# Patient Record
Sex: Male | Born: 1945 | Race: White | Hispanic: No | Marital: Married
Health system: Western US, Academic
[De-identification: ages and names within clinical notes are randomized; demographics above are authoritative.]

## PROBLEM LIST (undated history)

## (undated) DIAGNOSIS — E079 Disorder of thyroid, unspecified: Secondary | ICD-10-CM

## (undated) DIAGNOSIS — I10 Essential (primary) hypertension: Secondary | ICD-10-CM

## (undated) DIAGNOSIS — I8390 Asymptomatic varicose veins of unspecified lower extremity: Secondary | ICD-10-CM

## (undated) DIAGNOSIS — K649 Unspecified hemorrhoids: Secondary | ICD-10-CM

## (undated) DIAGNOSIS — E039 Hypothyroidism, unspecified: Secondary | ICD-10-CM

## (undated) DIAGNOSIS — R351 Nocturia: Secondary | ICD-10-CM

## (undated) DIAGNOSIS — E785 Hyperlipidemia, unspecified: Secondary | ICD-10-CM

## (undated) DIAGNOSIS — A630 Anogenital (venereal) warts: Secondary | ICD-10-CM

## (undated) DIAGNOSIS — K644 Residual hemorrhoidal skin tags: Secondary | ICD-10-CM

## (undated) HISTORY — DX: Anogenital (venereal) warts: A63.0

## (undated) HISTORY — DX: Asymptomatic varicose veins of unspecified lower extremity: I83.90

## (undated) HISTORY — DX: Residual hemorrhoidal skin tags: K64.4

## (undated) HISTORY — DX: Essential (primary) hypertension: I10

## (undated) HISTORY — DX: Hyperlipidemia, unspecified: E78.5

## (undated) HISTORY — DX: Disorder of thyroid, unspecified: E07.9

## (undated) HISTORY — PX: ANUS SURGERY: SHX302

## (undated) HISTORY — PX: CONDYLOMA EXCISION/FULGURATION: SHX1389

---

## 1977-05-11 DIAGNOSIS — A630 Anogenital (venereal) warts: Secondary | ICD-10-CM

## 1977-05-11 HISTORY — PX: OTHER SURGICAL HISTORY: SHX169

## 1977-05-11 HISTORY — DX: Anogenital (venereal) warts: A63.0

## 1980-05-11 HISTORY — PX: HEMORROIDECTOMY: SUR656

## 2006-05-11 HISTORY — PX: OTHER SURGICAL HISTORY: SHX169

## 2007-04-29 ENCOUNTER — Encounter: Payer: Self-pay | Admitting: Internal Medicine

## 2008-01-10 ENCOUNTER — Ambulatory Visit: Payer: Self-pay | Admitting: Vascular Surgery

## 2008-03-21 ENCOUNTER — Emergency Department (HOSPITAL_COMMUNITY): Admission: EM | Admit: 2008-03-21 | Discharge: 2008-03-21 | Payer: Self-pay | Admitting: Emergency Medicine

## 2010-01-15 ENCOUNTER — Encounter: Admission: RE | Admit: 2010-01-15 | Discharge: 2010-01-15 | Payer: Self-pay | Admitting: Family Medicine

## 2010-01-27 ENCOUNTER — Encounter: Payer: Self-pay | Admitting: Internal Medicine

## 2010-03-28 ENCOUNTER — Encounter: Payer: Self-pay | Admitting: Internal Medicine

## 2010-05-11 HISTORY — PX: OTHER SURGICAL HISTORY: SHX169

## 2010-05-23 ENCOUNTER — Encounter: Payer: Self-pay | Admitting: Internal Medicine

## 2010-06-04 ENCOUNTER — Ambulatory Visit
Admission: RE | Admit: 2010-06-04 | Discharge: 2010-06-04 | Payer: Self-pay | Source: Home / Self Care | Attending: Internal Medicine | Admitting: Internal Medicine

## 2010-06-04 DIAGNOSIS — E785 Hyperlipidemia, unspecified: Secondary | ICD-10-CM | POA: Insufficient documentation

## 2010-06-04 DIAGNOSIS — I10 Essential (primary) hypertension: Secondary | ICD-10-CM | POA: Insufficient documentation

## 2010-06-04 DIAGNOSIS — Z860100 Personal history of colon polyps, unspecified: Secondary | ICD-10-CM | POA: Insufficient documentation

## 2010-06-04 DIAGNOSIS — M545 Low back pain, unspecified: Secondary | ICD-10-CM | POA: Insufficient documentation

## 2010-06-04 DIAGNOSIS — E039 Hypothyroidism, unspecified: Secondary | ICD-10-CM | POA: Insufficient documentation

## 2010-06-12 NOTE — Assessment & Plan Note (Signed)
Summary: new to estab/cbs   Vital Signs:  Patient profile:   65 year old male Height:      71 inches Weight:      175.2 pounds BMI:     24.52 Temp:     98.4 degrees F oral Pulse rate:   52 / minute Resp:     14 per minute BP sitting:   128 / 80  (left arm) Cuff size:   large  Vitals Entered By: Shonna Chock CMA (June 04, 2010 10:42 AM) CC: New patient establish: Discuss back ache and muscle pain x 5-6 months , Back pain   CC:  New patient establish: Discuss back ache and muscle pain x 5-6 months  and Back pain.  History of Present Illness:    Mr. Ryan Townsend [facility])  is here to establish as a new patient; he has chronic , recurrent  back symptoms..  The patient denies  associated fever, chills, weakness, loss of sensation, fecal incontinence, urinary incontinence, and urinary retention.  The pain  had  been  located in the mid low back  intermittently &  chronically  for 5-6 years. That pain always responded to stretching exercises.Mid thoracic back began in mid July after air  flight from [place].  There was no injury or trigger for the  pain . The pain does not radiate.  The pain is made worse by  sitting for > 30 minutes.  The pain is made better by activity.      Hyperlipidemia Follow-Up:  The patient denies muscle aches, GI upset, abdominal pain, flushing, itching, constipation, diarrhea, and fatigue.  The patient denies the following symptoms: chest pain/pressure, exercise intolerance, dypsnea, palpitations, syncope, and pedal edema.  Compliance with medications (by patient report) has been near 100%.  Dietary compliance has been good.  Adjunctive measures currently used by the patient include fiber, ASA, and fish oil supplements.  Lipids were @ goal in 01/2010.     Hypertension Follow-Up:  The patient denies lightheadedness, urinary frequency, and headaches.  Adjunctive measures currently used by the patient include salt restriction.   BP averages < 140/90.  Preventive  Screening-Counseling & Management  Alcohol-Tobacco     Smoking Status: quit  Caffeine-Diet-Exercise     Does Patient Exercise: yes  Current Medications (verified): 1)  Lipitor 10 Mg Tabs (Atorvastatin Calcium) .Marland Kitchen.. 1 By Mouth Once Daily 2)  Diovan 160 Mg Tabs (Valsartan) .Marland Kitchen.. 1 By Mouth Once Daily 3)  Synthroid 75 Mcg Tabs (Levothyroxine Sodium) .Marland Kitchen.. 1 By Mouth Once Daily 4)  Aspirin 81 Mg Tabs (Aspirin) .Marland Kitchen.. 1 By Mouth Once Daily 5)  Fish Oil 1000 Mg Caps (Omega-3 Fatty Acids) .Marland Kitchen.. 1 By Mouth Once Daily 6)  Metamucil 30.9 % Powd (Psyllium) .... As Needed  Allergies (verified): No Known Drug Allergies  Past History:  Past Medical History: Hypothyroidism Hyperlipidemia Hypertension Colonic polyps, hx of , Tubular Adenoma 2008, Dr Elnoria Howard; Mother: colon cancer;2  M cousins : colon cancer Vitamin D level 38.1; FBS 100  Past Surgical History: Hemorrhoidectomy  in  1982 Colon polypectomy  04/29/2007: Tubular Adenoma , Dr Elnoria Howard  Family History: Father: deceased, CAD/ MI @ 22, AAA Mother: deceased ,Colon Cancer, stroke,HTN Siblings: 1 sister:Chronic Kidney Disease; 1 brother:HTN, lipids; 1 sister: deceased, small cell  lung cancer  Social History: Occupation:Principle Scientist IT trainer) Married Former Smoker: quit 1978 Alcohol use-yes: rarely Regular exercise-yes: walking 30 min 5X / week ; cardio & weights 4X/ week X 60 min Smoking Status:  quit Does Patient Exercise:  yes  Review of Systems General:  Denies sleep disorder. Eyes:  Denies blurring, double vision, and vision loss-both eyes. ENT:  Denies difficulty swallowing and hoarseness. Resp:  Denies cough and sputum productive. GU:  Denies discharge, dysuria, and hematuria; PSA 1.3 in 09/11. MS:  Denies joint pain, joint redness, and joint swelling. Derm:  Denies changes in nail beds, dryness, hair loss, and lesion(s). Neuro:  Denies numbness and tingling. Psych:  Denies depression; Intermittent  anxiety. Endo:  Denies cold intolerance, excessive hunger, excessive thirst, excessive urination, and heat intolerance; TSH was 6.97 in 09/11; 4.16 in 11/11; 1.77 in 01/12. FBS 100. Heme:  Denies abnormal bruising and bleeding. Allergy:  Complains of seasonal allergies; Cortisone shot annually.  Physical Exam  General:  well-nourished,in no acute distress; alert,appropriate and cooperative throughout examination Head:  Normocephalic and atraumatic without obvious abnormalities. Pattern  alopecia  Eyes:  No corneal or conjunctival inflammation noted. EOMI. Perrla. Funduscopic exam benign, without hemorrhages, exudates or papilledema. Vision grossly normal. Ears:  External ear exam shows no significant lesions or deformities.  Otoscopic examination reveals clear canals, tympanic membranes are intact bilaterally without bulging, retraction, inflammation or discharge. Hearing is grossly normal bilaterally. Nose:  External nasal examination shows no deformity or inflammation. Nasal mucosa are pink and moist without lesions or exudates. Mouth:  Oral mucosa and oropharynx without lesions or exudates.  Teeth in good repair. Neck:  No deformities, masses, or tenderness noted. Lungs:  Normal respiratory effort, chest expands symmetrically. Lungs are clear to auscultation, no crackles or wheezes. Heart:  Normal rate and regular rhythm. S1 and S2 normal without gallop, murmur, click, rub . Abdomen:  Bowel sounds positive,abdomen soft and non-tender without masses, organomegaly or hernias noted. No AAA or bruits Msk:  No deformity or scoliosis noted of thoracic or lumbar spine.   Pulses:  R and L carotid,radial,dorsalis pedis and posterior tibial pulses are full and equal bilaterally Extremities:  No clubbing, cyanosis, edema, or deformity noted with normal full range of motion of all joints.  Neg SLR . Crepitus L knee > R. Slight flexion of L 5th digit  Neurologic:  alert & oriented X3, strength normal in  all extremities, gait (heel & toe) normal, and DTRs symmetrical and normal.   Skin:  Intact without suspicious lesions or rashes Cervical Nodes:  No lymphadenopathy noted Axillary Nodes:  No palpable lymphadenopathy Psych:  memory intact for recent and remote, normally interactive, and good eye contact.     Impression & Recommendations:  Problem # 1:  LOW BACK PAIN, CHRONIC (ICD-724.2)  His updated medication list for this problem includes:    Aspirin 81 Mg Tabs (Aspirin) .Marland Kitchen... 1 by mouth once daily    Cyclobenzaprine Hcl 5 Mg Tabs (Cyclobenzaprine hcl) .Marland Kitchen... 1-2 at bedtime as needed for back pain  Problem # 2:  HYPERTENSION (ICD-401.9)  His updated medication list for this problem includes:    Diovan 160 Mg Tabs (Valsartan) .Marland Kitchen... 1 by mouth once daily  Problem # 3:  HYPERLIPIDEMIA (ICD-272.4) Lipids @ goal His updated medication list for this problem includes:    Lipitor 10 Mg Tabs (Atorvastatin calcium) .Marland Kitchen... 1 by mouth once daily  Problem # 4:  HYPOTHYROIDISM (ICD-244.9) TSH therapeutic His updated medication list for this problem includes:    Synthroid 75 Mcg Tabs (Levothyroxine sodium) .Marland Kitchen... 1 by mouth once daily  Problem # 5:  COLONIC POLYPS, HX OF (ICD-V12.72)  Tubular Adenoma ; FH of colon cancer  Complete  Medication List: 1)  Lipitor 10 Mg Tabs (Atorvastatin calcium) .Marland Kitchen.. 1 by mouth once daily 2)  Diovan 160 Mg Tabs (Valsartan) .Marland Kitchen.. 1 by mouth once daily 3)  Synthroid 75 Mcg Tabs (Levothyroxine sodium) .Marland Kitchen.. 1 by mouth once daily 4)  Aspirin 81 Mg Tabs (Aspirin) .Marland Kitchen.. 1 by mouth once daily 5)  Fish Oil 1000 Mg Caps (Omega-3 fatty acids) .Marland Kitchen.. 1 by mouth once daily 6)  Metamucil 30.9 % Powd (Psyllium) .... As needed 7)  Cyclobenzaprine Hcl 5 Mg Tabs (Cyclobenzaprine hcl) .Marland Kitchen.. 1-2 at bedtime as needed for back pain  Patient Instructions: 1)  Consider fasting Labs: [place] Heart Panel (1304X) & A1c . Codes: 272.4, V17.3, 790.29. Stretching exercises as discussed. Consider  a trial of Chondroitin 800 mg once daily  for degenerative changes. A copy of this report will be sent to Dr Elnoria Howard ; he will determine appropriate survelliance interval for   colon polyps (TUBULAR ADENOMA  04/2007) Prescriptions: CYCLOBENZAPRINE HCL 5 MG TABS (CYCLOBENZAPRINE HCL) 1-2 at bedtime as needed for back pain  #20 x 0   Entered and Authorized by:   Marga Melnick MD   Signed by:   Marga Melnick MD on 06/04/2010   Method used:   Print then Give to Patient   RxID:   [id number]    Orders Added: 1)  New Patient Level IV [99204]   Immunization History:  Tetanus/Td Immunization History:    Tetanus/Td:  historical (05/11/2006)   Immunization History:  Tetanus/Td Immunization History:    Tetanus/Td:  Historical (05/11/2006)

## 2010-06-26 NOTE — Procedures (Signed)
Summary: Colonoscopy/[facility]  Colonoscopy/[facility]   Imported By: Sherian Rein 06/17/2010 11:32:00  _____________________________________________________________________  External Attachment:    Type:   Image     Comment:   External Document

## 2010-09-23 NOTE — Consult Note (Signed)
VASCULAR SURGERY CONSULTATION   Ryan Townsend, Ryan Townsend  DOB:  1945-12-10                                       01/10/2008  CHART#:[id number]   This is a vascular surgery consultation.  The patient is a 65 year old  male patient who has been having increasing varicosities in the left leg  over the last several months.  He first noted a cluster of varicosities  in the distal left thigh several months ago but less than a year ago and  this has now extended fairly rapidly down the medial aspect of his left  leg to the ankle area with large bulbous varicosities.  These begin to  feel heavy and ache toward the end of the day but he does not have  severe pain.  He has no distal swelling, has had no history of  hemorrhage, ulceration, thrombophlebitis or deep venous thrombosis but  states that these have been progressing very rapidly.  He has not worn  elastic compression stockings nor tried elevation of the legs nor pain  medicine on a regular basis.   PAST MEDICAL HISTORY:  1. Hypertension.  2. Negative for diabetes, coronary artery disease, hyperlipidemia,      COPD or stroke.   PAST SURGICAL HISTORY:  Includes hemorrhoidectomy.   FAMILY HISTORY:  Positive for hypertension and stroke in his mother and  abdominal aortic aneurysm and myocardial infarction in his father.  Negative for diabetes.   SOCIAL HISTORY:  He is married and has three children.  Works as a  Chiropodist at [employer].  He has not smoked since 1978 and does not use  alcohol on a regular basis.   REVIEW OF SYSTEMS:  Denies any chest pain, dyspnea on exertion, PND,  orthopnea, claudication, wheezing, productive cough, GI or GU symptoms.  GENERAL:  He is very healthy and active.   ALLERGIES:  None known.   MEDICATIONS:  Please see health history form.   PHYSICAL EXAMINATION:  Vital signs:  Blood pressure 138/64, heart rate  60, respirations 14.  General:  He is a healthy-appearing male in no  apparent distress, alert and oriented x3.  Neck:  Is supple, 3+ carotid  pulses palpable.  No bruits are audible.  Neurological:  Normal.  No  palpable adenopathy in the neck.  Chest:  Clear to auscultation.  Cardiovascular:  Regular rhythm.  No murmurs.  Abdomen:  Soft, nontender  with no palpable masses.  He has 3+ femoral, popliteal, dorsalis pedis  and posterior tibial pulses palpable bilaterally.  Both feet are well-  perfused with no edema.  The left leg has severe greater saphenous  varicosities beginning in the distal thigh extending down into the  medial calf.  He has no hyperpigmentation or ulceration but does have  large bulbous varicosities.   Venous duplex exam was performed today which reveals gross incompetence  at the left saphenofemoral junction and throughout the left great  saphenous vein communicating with these varicosities.  Deep venous  system was widely patent and the left lesser saphenous vein is  competent.   I think that he does have symptomatic venous insufficiency of the left  leg with varicosities which are enlarging rapidly.  His symptoms are  beginning to affect his daily living and are quite troublesome to him as  is the rapid progression of these varicosities.  I have recommended  that  we try graduated elastic compression stockings (20 mm - 30 mm - long  leg) as well as elevation of the leg and medication on a regular basis  to relieve the symptoms.  He will try ibuprofen.  I will see him back in  3-4 months to see how he is progressing and see how the symptomatology  is being effected by his conservative measures.  He would be a good  candidate for laser ablation of the left great saphenous vein with  multiple stab phlebectomies if his conservative measures are not  successful.   Quita Skye Hart [place], M.D.  Electronically Signed  JDL/MEDQ  D:  01/10/2008  T:  01/11/2008  Job:  [id number]   cc:   Currie Paris, M.D.

## 2010-09-23 NOTE — Procedures (Signed)
LOWER EXTREMITY VENOUS REFLUX EXAM   INDICATION:  Left leg varicose vein.   EXAM:  Using color-flow imaging and pulse Doppler spectral analysis, the  left common femoral, superficial femoral, popliteal, posterior tibial,  greater and lesser saphenous veins are evaluated.  There is evidence  suggesting deep venous insufficiency in the left common femoral vein  only.   The left saphenofemoral junction is not competent.  The left GSV is not  competent with the caliber as described below.   The left proximal short saphenous vein demonstrates competency.   GSV Diameter (used if found to be incompetent only)                                            Right    Left  Proximal Greater Saphenous Vein           cm       0.62 cm  Proximal-to-mid-thigh                     cm       0.48 cm  Mid thigh                                 cm       0.36 cm  Mid-distal thigh                          cm       0.42 cm  Distal thigh                              cm       0.48 cm  Knee                                      cm       0.40 cm    IMPRESSION:  1. Left greater saphenous vein reflux is identified with the caliber      ranging from 0.36 cm to 0.62 cm knee to groin.  2. The left greater saphenous vein is not aneurysmal.  3. The left greater saphenous vein is not tortuous.  4. The deep venous system is competent except for the left common      femoral vein.  5. The left lesser saphenous vein is competent.       ___________________________________________  Quita Skye. Hart [place], M.D.   MC/MEDQ  D:  01/10/2008  T:  01/10/2008  Job:  [id number]

## 2011-10-01 ENCOUNTER — Telehealth (INDEPENDENT_AMBULATORY_CARE_PROVIDER_SITE_OTHER): Payer: Self-pay

## 2011-10-01 NOTE — Telephone Encounter (Signed)
LMOM at home stating there has been a change on 6-6 and we needed to R/s appt to 6-24.

## 2011-10-15 ENCOUNTER — Ambulatory Visit (INDEPENDENT_AMBULATORY_CARE_PROVIDER_SITE_OTHER): Payer: Self-pay | Admitting: Surgery

## 2011-11-02 ENCOUNTER — Encounter (INDEPENDENT_AMBULATORY_CARE_PROVIDER_SITE_OTHER): Payer: Self-pay | Admitting: Surgery

## 2011-11-02 ENCOUNTER — Ambulatory Visit (INDEPENDENT_AMBULATORY_CARE_PROVIDER_SITE_OTHER): Payer: BC Managed Care – PPO | Admitting: Surgery

## 2011-11-02 VITALS — BP 130/79 | HR 54 | Temp 98.2°F | Ht 69.0 in | Wt 178.6 lb

## 2011-11-02 DIAGNOSIS — K644 Residual hemorrhoidal skin tags: Secondary | ICD-10-CM | POA: Insufficient documentation

## 2011-11-02 HISTORY — DX: Residual hemorrhoidal skin tags: K64.4

## 2011-11-02 NOTE — Patient Instructions (Signed)

## 2011-11-02 NOTE — Progress Notes (Signed)
Subjective:     Patient ID: Ryan Townsend, male   DOB: 1945/12/26, 66 y.o.   MRN: [id number]  HPI  Ryan Townsend  20-Apr-1946 [id number]  Patient Care Team: Linna Hoff, MD as PCP - General (Family Medicine) Griffith Citron, MD as Consulting Physician (Gastroenterology)  This patient is a 66 y.o.male who presents today for surgical evaluation at the request of Dr. Artis Flock.   Reason for visit: Painful and no mass. Question of hemorrhoid.  Patient is a pleasant gentleman who has struggled with hemorrhoids for many decades. He had hemorrhoidectomy in 1982. He try banding before that. He was sore but he recovered. He recalls that they could not do it all at once. He was disappointed in that. He has been taking Metamucil every day since.  He thinks he had anal warts removed in the late 70s as well.  No recurrence or treatment since  A few years ago, he was diagnosed with pruritus ani.   He was placed on hydrocortisone cream and that seemed to help. In October 2012 he was noted to have some bright red blood. He underwent colonoscopy. Dr. Kinnie Scales thought he had some enlarged hemorrhoids. He ended up having office banding done by Dr. Kinnie Scales x 3 visits. The patient notes he was getting persistent itching and flares. It gradually seemed to worsen. Occasionally notes anal discharge. Underwear often lightly stained.  He feels like his bowel movements are not as good since the colonoscopy. Some occasional fecal urgency. He went to [place] for a month, suppositories seemed to control it OK. However he finds that the area is starting to itch & get irritated more. He stopped Metamucil and did not notice any different. He had been feeling gassy.  Patient Active Problem List  Diagnosis  . HYPOTHYROIDISM  . HYPERLIPIDEMIA  . HYPERTENSION  . LOW BACK PAIN, CHRONIC  . COLONIC POLYPS, HX OF    Past Medical History  Diagnosis Date  . Hyperlipidemia   . Hypertension   . Thyroid disease   . Varicose  vein   . Anal warts 1979    Past Surgical History  Procedure Date  . Hemorroidectomy 1982  . Removal of anal warts 1979    History   Social History  . Marital Status: Married    Spouse Name: N/A    Number of Children: N/A  . Years of Education: N/A   Occupational History  . Education administrator   Social History Main Topics  . Smoking status: Former Smoker    Quit date: 11/01/1976  . Smokeless tobacco: Not on file  . Alcohol Use: Yes     occ  . Drug Use: No  . Sexually Active:    Other Topics Concern  . Not on file   Social History Narrative  . No narrative on file    Family History  Problem Relation Age of Onset  . Cancer Mother     colon  . Cancer Sister     lung    Current Outpatient Prescriptions  Medication Sig Dispense Refill  . aspirin 81 MG tablet Take 81 mg by mouth daily.      Marland Kitchen atorvastatin (LIPITOR) 10 MG tablet       . DIOVAN 160 MG tablet       . SYNTHROID 75 MCG tablet       . hydrocortisone (ANUSOL-HC) 25 MG suppository       . NASONEX 50 MCG/ACT nasal  spray          No Known Allergies  BP 130/79  Pulse 54  Temp 98.2 F (36.8 C) (Temporal)  Ht 5\' 9"  (1.753 m)  Wt 178 lb 9.6 oz (81.012 kg)  BMI 26.37 kg/m2  SpO2 98%  No results found.   Review of Systems  Constitutional: Negative for fever, chills and diaphoresis.  HENT: Negative for nosebleeds, sore throat, facial swelling, mouth sores, trouble swallowing and ear discharge.   Eyes: Negative for photophobia, discharge and visual disturbance.  Respiratory: Negative for choking, chest tightness, shortness of breath and stridor.   Cardiovascular: Negative for chest pain and palpitations.  Gastrointestinal: Positive for anal bleeding and rectal pain. Negative for nausea, vomiting, abdominal pain, diarrhea, constipation, blood in stool and abdominal distention.  Genitourinary: Negative for dysuria, urgency, difficulty urinating and testicular pain.    Musculoskeletal: Negative for myalgias, back pain, arthralgias and gait problem.  Skin: Negative for color change, pallor, rash and wound.  Neurological: Negative for dizziness, speech difficulty, weakness, numbness and headaches.  Hematological: Negative for adenopathy. Does not bruise/bleed easily.  Psychiatric/Behavioral: Negative for hallucinations, confusion and agitation.       Objective:   Physical Exam  Constitutional: He is oriented to person, place, and time. He appears well-developed and well-nourished. No distress.  HENT:  Head: Normocephalic.  Mouth/Throat: Oropharynx is clear and moist. No oropharyngeal exudate.  Eyes: Conjunctivae and EOM are normal. Pupils are equal, round, and reactive to light. No scleral icterus.  Neck: Normal range of motion. Neck supple. No tracheal deviation present.  Cardiovascular: Normal rate, regular rhythm and intact distal pulses.   Pulmonary/Chest: Effort normal and breath sounds normal. No respiratory distress.  Abdominal: Soft. He exhibits no distension. There is no tenderness. Hernia confirmed negative in the right inguinal area and confirmed negative in the left inguinal area.  Genitourinary:       Exam done with assistance of male Medical Assistant in the room.  Perianal skin clean with good hygiene.  No pruritis.   No pilonidal disease.  No fissure.  No abscess/fistula.  Left posterior ext hem with white scarring/discoloration.  Not c/w wart.  Mild TTP - source of Sx.  R lat ext hem tag  milder  Tolerates digital and anoscopic rectal exam.  Normal sphincter tone.  No rectal masses.  Hemorrhoidal piles internally mildly inflamed in anal canal   Musculoskeletal: Normal range of motion. He exhibits no tenderness.  Lymphadenopathy:    He has no cervical adenopathy.       Right: No inguinal adenopathy present.       Left: No inguinal adenopathy present.  Neurological: He is alert and oriented to person, place, and time. No cranial nerve  deficit. He exhibits normal muscle tone. Coordination normal.  Skin: Skin is warm and dry. No rash noted. He is not diaphoretic. No erythema. No pallor.  Psychiatric: He has a normal mood and affect. His behavior is normal. Judgment and thought content normal.       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  The pathophysiology of hemorrhoids and differential diagnosis was discussed.  Natural history risks without surgery was discussed.   I stressed the importance of a bowel regimen to have daily soft bowel movements to minimize progression of disease.  Interventions such as sclerotherapy & banding were discussed but the external location makes these options  not tolerable in an outpatient setting.  The patient's symptoms are not adequately controlled by medicines and other non-operative treatments.  I feel the risks & problems of no surgery outweigh the operative risks; therefore, I recommended surgery to treat the hemorrhoids by resection.  i do not think that he requires [facility] as this is more a local external hemorrhoid issue.  Risks such as bleeding, infection, need for further treatment, heart attack, death, and other risks were discussed.   I noted a good likelihood this will help address the problem.  Goals of post-operative recovery were discussed as well.  Possibility that this will not correct all symptoms was explained.  Post-operative pain, bleeding, constipation, and other problems after surgery were discussed.  We will work to minimize complications.   Educational handouts further explaining the pathology, treatment options, and bowel regimen were given as well.  Questions were answered.  The patient expresses understanding & wishes to consider to proceed with surgery.  He will call us.

## 2011-11-10 ENCOUNTER — Telehealth (INDEPENDENT_AMBULATORY_CARE_PROVIDER_SITE_OTHER): Payer: Self-pay

## 2011-11-10 NOTE — Telephone Encounter (Signed)
Requested Dr [facility] office notes on pt to be faxed to DR Gross. We did not need colonoscopy just office notes.

## 2011-11-26 ENCOUNTER — Ambulatory Visit (INDEPENDENT_AMBULATORY_CARE_PROVIDER_SITE_OTHER): Payer: Self-pay | Admitting: Surgery

## 2011-11-27 ENCOUNTER — Encounter (INDEPENDENT_AMBULATORY_CARE_PROVIDER_SITE_OTHER): Payer: Self-pay

## 2011-12-02 ENCOUNTER — Encounter (INDEPENDENT_AMBULATORY_CARE_PROVIDER_SITE_OTHER): Payer: Self-pay | Admitting: Surgery

## 2011-12-02 ENCOUNTER — Ambulatory Visit (INDEPENDENT_AMBULATORY_CARE_PROVIDER_SITE_OTHER): Payer: BC Managed Care – PPO | Admitting: Surgery

## 2011-12-02 VITALS — BP 148/82 | HR 56 | Temp 97.6°F | Resp 16 | Ht 69.0 in | Wt 179.6 lb

## 2011-12-02 DIAGNOSIS — Z87898 Personal history of other specified conditions: Secondary | ICD-10-CM | POA: Insufficient documentation

## 2011-12-02 DIAGNOSIS — K644 Residual hemorrhoidal skin tags: Secondary | ICD-10-CM

## 2011-12-02 DIAGNOSIS — Z87448 Personal history of other diseases of urinary system: Secondary | ICD-10-CM

## 2011-12-02 MED ORDER — TAMSULOSIN HCL 0.4 MG PO CAPS: 0.4000 mg | ORAL_CAPSULE | Freq: Every day | ORAL | Status: DC

## 2011-12-02 NOTE — Patient Instructions (Addendum)
HEMORRHOIDS   The rectum is the last few inches of your colon, and it naturally stretches to hold stool.  Hemorrhoidal piles are natural clusters of blood vessels that help the rectum stretch to hold stool and allow bowel movements to eliminate feces.  Hemorrhoids are abnormally swollen blood vessels in the rectum.  Too much pressure in the rectum causes hemorrhoids by forcing blood to stretch and bulge the walls of the veins, sometimes even rupturing them.  Hemorrhoids can become like varicose veins you might see on a person's legs. When bulging hemorrhoidal veins are irritated, they can swell, burn, itch, become very painful, and bleed. Once the rectal veins have been stretched out and hemorrhoids created, they are difficult to get rid of completely and tend to recur with less straining than it took to cause them in the first place. Fortunately, good habits and simple medical treatment usually control hemorrhoids well, and surgery is only recommended in unusually severe cases. Some of the most frequent causes of hemorrhoids:    Constant sitting    Straining with bowel movements (from constipation or hard stools)    Diarrhea    Sitting on the toilet for a long time    Severe coughing    Childbirth    Heavy Lifting  Types of Hemorrhoids:    Internal hemorrhoids usually don't hurt or itch; they are deep inside the rectum and usually have no sensation. However, internal hemorrhoids can bleed.  Such bleeding should not be ignored and mask blood from a dangerous source like colorectal cancer, so persistent rectal bleeding should be investigated with a colonoscopy.    External hemorrhoids cause most of the symptoms - pain, burning, and itching. Unirritated hemorrhoids can look like small skin tags coming out of the anus.     Thrombosed hemorrhoids can form when a hemorrhoid blood vessel bursts and causes the hemorrhoid to swell.  A purple blood clot can form in it and become an excruciatingly painful lump  at the anus. Because of these unpleasant symptoms, immediate incision and drainage by a surgeon at an office visit can provide much relief of the pain.    PREVENTION Avoiding the causes listed in above will prevent most cases of hemorrhoids, but this advice is sometimes hard to follow:  How can you avoid sitting all day if you have a seated job? Also, we try to avoid coughing and diarrhea, but sometimes it's beyond your control.  Still, there are some practical hints to help:    If your main job activity is seated, always stand or walk during your breaks. Make it a point to stand and walk at least 5 minutes every hour and try to shift frequently in your chair to avoid direct rectal pressure.    Always exhale as you strain or lift. Don't hold your breath.    Treat coughing, diarrhea and constipation early since irritated hemorrhoids may soon follow.    Do not delay or try to prevent a bowel movement when the urge is present.   Exercise regularly (walking or jogging 60 minutes a day) to stimulate the bowels to move.   Avoid dry toilet paper when cleaning after bowel movements.  Moistened tissues such as baby wipes are less irritating.  Lightly pat the rectal area dry.  Using irrigating showers or bottle irrigation washing can more gently clean this sensitive area.   Keep the anal and genital area clean and  dry.  Talcum or baby powders can help   GET   YOUR STOOLS SOFT.   This is the most important way to prevent irritated hemorrhoids.  Hard stools are like sandpaper to the anorectal canal and will cause more problems.   The goal: ONE SOFT BOWEL MOVEMENT A DAY!  To have soft, regular bowel movements:    Drink at least 8 tall glasses of water a day.     AVOID CONSTIPATION    Take plenty of fiber.  Fiber is the undigested part of plant food that passes into the colon, acting s "natures broom" to encourage bowel motility and movement.  Fiber can absorb and hold large amounts of water. This results in a  larger, bulkier stool, which is soft and easier to pass. Work gradually over several weeks up to 6 servings a day of fiber (25g a day even more if needed) in the form of: o Vegetables -- Root (potatoes, carrots, turnips), leafy green (lettuce, salad greens, celery, spinach), or cooked high residue (cabbage, broccoli, etc) o Fruit -- Fresh (unpeeled skin & pulp), Dried (prunes, apricots, cherries, etc ),  or stewed ( applesauce)  o Whole grain breads, pasta, etc (whole wheat)  o Bran cereals    Bulking Agents -- This type of water-retaining fiber generally is easily obtained each day by one of the following:  o Psyllium bran -- The psyllium plant is remarkable because its ground seeds can retain so much water. This product is available as Metamucil, Konsyl, Effersyllium, Per Diem Fiber, or the less expensive generic preparation in drug and health food stores. Although labeled a laxative, it really is not a laxative.  o Methylcellulose -- This is another fiber derived from wood which also retains water. It is available as Citrucel. o Polyethylene Glycol - and "artificial" fiber commonly called Miralax or Glycolax.  It is helpful for people with gassy or bloated feelings with regular fiber o Flax Seed - a less gassy fiber than psyllium   No reading or other relaxing activity while on the toilet. If bowel movements take longer than 5 minutes, you are too constipated   Laxatives can be useful for a short period if constipation is severe o Osmotics (Milk of Magnesia, Fleets phosphosoda, Magnesium citrate, MiraLax, GoLytely) are safer than  o Stimulants (Senokot, Castor Oil, Dulcolax, Ex Lax)    o Do not take laxatives for more than 7days in a row.   Laxatives are not a good long-term solution as it can stress the intestine and colon and causes too much mineral and fluid losses.    If badly constipated, try a Bowel Retraining Program: o Do not use laxatives.  o Eat a diet high in roughage, such as bran  cereals and leafy vegetables.  o Drink six (6) ounces of prune or apricot juice each morning.  o Eat two (2) large servings of stewed fruit each day.  o Take one (1) heaping dose of a bulking agent (ex. Metamucil, Citrucel, Miralax) twice a day.  o Use sugar-free sweetener when possible to avoid excessive calories.  o Eat a normal breakfast.  o Set aside 15 minutes after breakfast to sit on the toilet, but do not strain to have a bowel movement.  o If you do not have a bowel movement by the third day, use an enema and repeat the above steps.    AVOID DIARRHEA o Switch to liquids and simpler foods for a few days to avoid stressing your intestines further. o Avoid dairy products (especially milk & ice cream) for a short   time.  The intestines often can lose the ability to digest lactose when stressed. o Avoid foods that cause gassiness or bloating.  Typical foods include beans and other legumes, cabbage, broccoli, and dairy foods.  Every person has some sensitivity to other foods, so listen to our body and avoid those foods that trigger problems for you. o Adding fiber (Citrucel, Metamucil, psyllium, Miralax) gradually can help thicken stools by absorbing excess fluid and retrain the intestines to act more normally.  Slowly increase the dose over a few weeks.  Too much fiber too soon can backfire and cause cramping & bloating. o Probiotics (such as active yogurt, Align, etc) may help repopulate the intestines and colon with normal bacteria and calm down a sensitive digestive tract.  Most studies show it to be of mild help, though, and such products can be costly. o Medicines:   Bismuth subsalicylate (ex. Kayopectate, Pepto Bismol) every 30 minutes for up to 6 doses can help control diarrhea.  Avoid if pregnant.   Loperamide (Immodium) can slow down diarrhea.  Start with two tablets (4mg total) first and then try one tablet every 6 hours.  Avoid if you are having fevers or severe pain.  If you are not  better or start feeling worse, stop all medicines and call your doctor for advice o Call your doctor if you are getting worse or not better.  Sometimes further testing (cultures, endoscopy, X-ray studies, bloodwork, etc) may be needed to help diagnose and treat the cause of the diarrhea.   If these preventive measures fail, you must take action right away! Hemorrhoids are one condition that can be mild in the morning and become intolerable by nightfall.     Anal Pruritus Anal pruritus is an itching of the anus, which is often due to increased moisture of the skin around the anus. Moisture may be due to sweating or a small amount of remaining stool. The itching and scratching can cause further skin damage.  CAUSES   Poor hygiene.   Excessive moisture from sweating or residual stool in the anal area.   Perfumed soaps and sprays and colored toilet paper.   Chemicals in the foods you eat.   Dietary factors such as caffeine, beer, milk products, chocolate, nuts, citrus fruits, tomatoes, spicy seasonings, jalapeno peppers, and salsa.   Hemorrhoids, infections, and other anal diseases.   Excessive washing.   Overuse of laxatives.   Skin disorders (psoriasis, eczema, or seborrhea).  HOME CARE INSTRUCTIONS   Practice good hygiene.   Clean the anal area gently with wet toilet paper, baby wipes, or a wet washcloth after every bowel movement and at bedtime. Avoid using soaps on the anal area. Dry the area thoroughly. Pat the area dry with toilet paper or a towel.   Do not scrub the anal area with anything, even toilet paper.   Try not to scratch the itchy area. Scratching produces more damage, which makes the itching worse.   Take sitz baths in warm water for 15 to 20 minutes, 2 to 3 times a day. Pat the area dry with a soft cloth after each bath.   Zinc oxide ointment or a moisture barrier cream can be applied several times daily to protect the skin.   Only take medicines as directed  by your caregiver.   Talk to your caregiver about fiber supplements. These are helpful in normalizing the stool if you have frequent loose stools.   Wear cotton underwear and loose clothing.     Do not use irritants such as bubble baths, scented toilet paper, or genital deodorants.  SEEK MEDICAL CARE IF:   Itching does not improve in several days or gets worse.   You have a fever.   There are problems with increased pain, swelling, or redness.  MAKE SURE YOU:   Understand these instructions.   Will watch your condition.   Will get help right away if you are not doing well or get worse.  Document Released: 10/27/2010 Document Revised: 04/16/2011 Document Reviewed: 10/27/2010 [employer] Patient Information 2012 [employer]. 

## 2011-12-02 NOTE — Progress Notes (Signed)
Subjective:     Patient ID: Ryan Townsend, male   DOB: 03-Sep-1945, 66 y.o.   MRN: [id number]  HPI   Ryan Townsend  Jan 07, 1946 [id number]  Patient Care Team: Linna Hoff, MD as PCP - General (Family Medicine) Griffith Citron, MD as Consulting Physician (Gastroenterology)  This patient is a 66 y.o.male who presents today for surgical evaluation at the request of Dr. Artis Flock.   Reason for visit: Question about hemorrhoid surgery  Patient is a pleasant gentleman who has struggled with hemorrhoids for many decades. He had hemorrhoidectomy in 1982. He try banding before that. He was sore but he recovered. He recalls that they could not do it all at once. He was disappointed in that. He has been taking Metamucil every day since.  He thinks he had anal warts removed in the late 70s as well.  No recurrence or treatment since  A few years ago, he was diagnosed with pruritus ani.   He was placed on hydrocortisone cream and that seemed to help. In October 2012 he was noted to have some bright red blood. He underwent colonoscopy. Dr. Kinnie Scales thought he had some enlarged hemorrhoids. He ended up having office banding done by Dr. Kinnie Scales x 3 visits. The patient notes he was getting persistent itching and flares. It gradually seemed to worsen. Occasionally notes anal discharge. Underwear often lightly stained.  He feels like his bowel movements are not as good since the colonoscopy. Some occasional fecal urgency. He went to [place] for a month, suppositories seemed to control it OK. However he finds that the area is starting to itch & get irritated more. He stopped Metamucil and did not notice any different. He had been feeling gassy.  I had recommended external hemorrhoidectomy.  He comes today with more questions.  He does note he had urinary retention with his hemorrhoidectomy 1982 and wondered if that was going to be an issue.  Denies any difficulty with urination no prostate problems.  No  hesitancy.  He restarted the Metamucil but felt like it was getting too soft to back off.  He wanted to know what options he had he does note he is transitioned over to a more high fiber diet and has a salad at dinner time.  Patient Active Problem List  Diagnosis  . HYPOTHYROIDISM  . HYPERLIPIDEMIA  . HYPERTENSION  . LOW BACK PAIN, CHRONIC  . COLONIC POLYPS, HX OF  . External hemorrhoids with pain & itching  . H/O urinary retention after hemorrhoidectomy 1982    Past Medical History  Diagnosis Date  . Hyperlipidemia   . Hypertension   . Thyroid disease   . Varicose vein   . Anal warts 1979    Past Surgical History  Procedure Date  . Hemorroidectomy 1982  . Removal of anal warts 1979    History   Social History  . Marital Status: Married    Spouse Name: N/A    Number of Children: N/A  . Years of Education: N/A   Occupational History  . Education administrator   Social History Main Topics  . Smoking status: Former Smoker    Quit date: 11/01/1976  . Smokeless tobacco: Not on file  . Alcohol Use: Yes     occ  . Drug Use: No  . Sexually Active:    Other Topics Concern  . Not on file   Social History Narrative  . No narrative on file  Family History  Problem Relation Age of Onset  . Cancer Mother     colon  . Hypertension Mother   . Cancer Sister     lung  . Kidney disease Sister     Current Outpatient Prescriptions  Medication Sig Dispense Refill  . aspirin 81 MG tablet Take 81 mg by mouth daily.      Marland Kitchen atorvastatin (LIPITOR) 10 MG tablet       . DIOVAN 160 MG tablet       . SYNTHROID 75 MCG tablet       . hydrocortisone (ANUSOL-HC) 25 MG suppository       . NASONEX 50 MCG/ACT nasal spray       . Tamsulosin HCl (FLOMAX) 0.4 MG CAPS Take 1 capsule (0.4 mg total) by mouth daily after supper.  20 capsule  1     No Known Allergies  BP 148/82  Pulse 56  Temp 97.6 F (36.4 C) (Temporal)  Resp 16  Ht 5\' 9"  (1.753 m)   Wt 179 lb 9.6 oz (81.466 kg)  BMI 26.52 kg/m2  No results found.   Review of Systems  Constitutional: Negative for fever, chills and diaphoresis.  HENT: Negative for nosebleeds, sore throat, facial swelling, mouth sores, trouble swallowing and ear discharge.   Eyes: Negative for photophobia, discharge and visual disturbance.  Respiratory: Negative for choking, chest tightness, shortness of breath and stridor.   Cardiovascular: Negative for chest pain and palpitations.  Gastrointestinal: Positive for anal bleeding and rectal pain. Negative for nausea, vomiting, abdominal pain, diarrhea, constipation, blood in stool and abdominal distention.  Genitourinary: Negative for dysuria, urgency, difficulty urinating and testicular pain.  Musculoskeletal: Negative for myalgias, back pain, arthralgias and gait problem.  Skin: Negative for color change, pallor, rash and wound.  Neurological: Negative for dizziness, speech difficulty, weakness, numbness and headaches.  Hematological: Negative for adenopathy. Does not bruise/bleed easily.  Psychiatric/Behavioral: Negative for hallucinations, confusion and agitation.       Objective:   Physical Exam  Constitutional: He is oriented to person, place, and time. He appears well-developed and well-nourished. No distress.  HENT:  Head: Normocephalic.  Mouth/Throat: Oropharynx is clear and moist. No oropharyngeal exudate.  Eyes: Conjunctivae and EOM are normal. Pupils are equal, round, and reactive to light. No scleral icterus.  Neck: Normal range of motion. Neck supple. No tracheal deviation present.  Cardiovascular: Normal rate, regular rhythm, normal heart sounds and intact distal pulses.   Pulmonary/Chest: Effort normal and breath sounds normal. No respiratory distress.  Abdominal: Soft. He exhibits no distension. There is no tenderness. Hernia confirmed negative in the right inguinal area and confirmed negative in the left inguinal area.    Genitourinary:       No rectal exam today   Musculoskeletal: Normal range of motion. He exhibits no tenderness.  Lymphadenopathy:    He has no cervical adenopathy.       Right: No inguinal adenopathy present.       Left: No inguinal adenopathy present.  Neurological: He is alert and oriented to person, place, and time. No cranial nerve deficit. He exhibits normal muscle tone. Coordination normal.  Skin: Skin is warm and dry. No rash noted. He is not diaphoretic. No erythema. No pallor.  Psychiatric: He has a normal mood and affect. His behavior is normal. Judgment and thought content normal.       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  I noted I would definitely remove the left and probably one on the right.  I cautioned that I don't want to be over aggressive to avoid stricturing.  He was worried about the risk of incontinence.  I noted soiling &  bleeding is common but severe incontinence is extremely rare since removing superficial to the sphincters.  He felt reassured.  The anatomy & physiology of the anorectal region was discussed.  The pathophysiology of hemorrhoids and differential diagnosis was discussed.  Natural history risks without surgery was discussed.   I stressed the importance of a bowel regimen to have daily soft bowel movements to minimize progression of disease.  Interventions such as sclerotherapy & banding were discussed but the external location makes these options not tolerable in an outpatient setting.  The patient's symptoms are not adequately controlled by medicines and other non-operative treatments.  I feel the risks & problems of no surgery outweigh the operative risks; therefore, I recommended surgery to treat the hemorrhoids by resection.  i do not think that he requires [facility] as this is more a local external hemorrhoid issue.  Risks such as bleeding, infection, need for further  treatment, heart attack, death, and other risks were discussed.   I noted a good likelihood this will help address the problem.  Goals of post-operative recovery were discussed as well.  Possibility that this will not correct all symptoms was explained.  Post-operative pain, bleeding, constipation, and other problems after surgery were discussed.  We will work to minimize complications.   Educational handouts further explaining the pathology, treatment options, and bowel regimen were given as well.  Questions were answered.  The patient expresses understanding & wishes to consider to proceed with surgery.  He will call us.  Given the fact he had urinary retention with the last hemorrhoidectomy, I offered to have him be on Flomax perioperatively to minimize risk of urinary retention.  He is open that idea.  We wrote prescription.

## 2011-12-25 ENCOUNTER — Encounter (HOSPITAL_COMMUNITY): Payer: Self-pay

## 2011-12-31 ENCOUNTER — Encounter (HOSPITAL_COMMUNITY): Payer: Self-pay

## 2011-12-31 ENCOUNTER — Encounter (HOSPITAL_COMMUNITY)
Admission: RE | Admit: 2011-12-31 | Discharge: 2011-12-31 | Disposition: A | Discharge status: Home / Self Care | Payer: BC Managed Care – PPO | Source: Ambulatory Visit | Attending: Surgery | Admitting: Surgery

## 2011-12-31 ENCOUNTER — Ambulatory Visit (HOSPITAL_COMMUNITY)
Admission: RE | Admit: 2011-12-31 | Discharge: 2011-12-31 | Disposition: A | Discharge status: Home / Self Care | Payer: BC Managed Care – PPO | Source: Ambulatory Visit | Attending: Surgery | Admitting: Surgery

## 2011-12-31 DIAGNOSIS — Z01818 Encounter for other preprocedural examination: Secondary | ICD-10-CM | POA: Insufficient documentation

## 2011-12-31 DIAGNOSIS — Z01812 Encounter for preprocedural laboratory examination: Secondary | ICD-10-CM | POA: Insufficient documentation

## 2011-12-31 HISTORY — DX: Nocturia: R35.1

## 2011-12-31 HISTORY — DX: Hypothyroidism, unspecified: E03.9

## 2011-12-31 HISTORY — DX: Unspecified hemorrhoids: K64.9

## 2011-12-31 LAB — SURGICAL PCR SCREEN: Staphylococcus aureus: NEGATIVE

## 2011-12-31 LAB — BASIC METABOLIC PANEL
BUN: 15 mg/dL (ref 6–23)
CO2: 29 mEq/L (ref 19–32)
Calcium: 9.8 mg/dL (ref 8.4–10.5)
Chloride: 102 mEq/L (ref 96–112)
GFR calc Af Amer: >90 mL/min (ref >90)
GFR calc non Af Amer: 87 mL/min — ABNORMAL LOW (ref >90)
Potassium: 4.2 mEq/L (ref 3.5–5.1)

## 2011-12-31 LAB — CBC
HCT: 42.7 % (ref 39.0–52.0)
Hemoglobin: 14.3 g/dL (ref 13.0–17.0)
MCHC: 33.5 g/dL (ref 30.0–36.0)

## 2011-12-31 NOTE — Pre-Procedure Instructions (Signed)
Pre op instructions given using the teach back method 

## 2011-12-31 NOTE — Patient Instructions (Signed)
20 Braylon Grenda  12/31/2011   Your procedure is scheduled on:  01-14-2012  Report to [facility] at 0530  AM.  Call this number if you have problems the morning of surgery: [phone]   Remember: driver wife nagmeh Krotzer cell [phone]   Do not eat food or drink liquids:After Midnight.  .  Take these medicines the morning of surgery with A SIP OF WATER: synthroid  Do not wear jewelry or make up.  Do not wear lotions, powders, or perfumes.Do not wear deodorant.    Do not bring valuables to the hospital.  Contacts, dentures or bridgework may not be worn into surgery.  Leave suitcase in the car. After surgery it may be brought to your room.  For patients admitted to the hospital, checkout time is 11:00 AM the day of discharge                             Patients discharged the day of surgery will not be allowed to drive home. If going home same day of surgery, you must have someone stay with you the first 24 hours at home and arrange for some one to drive you home from hospital.    Special Instructions: CHG Shower Use Special Wash: 1/2 bottle night before surgery and 1/2 bottle morning  of surgery, use regular soap on face and front and back private area. Women do not shave legs or underarms for 2 days before showers. Men may shave face morning of surgery.    Please read over the following fact sheets that you were given: MRSA Information  [facility] pre op nurse phone number [phone], call if needed

## 2012-01-14 ENCOUNTER — Encounter (HOSPITAL_COMMUNITY): Payer: Self-pay | Admitting: *Deleted

## 2012-01-14 ENCOUNTER — Encounter (HOSPITAL_COMMUNITY): Payer: Self-pay | Admitting: Anesthesiology

## 2012-01-14 ENCOUNTER — Ambulatory Visit (HOSPITAL_COMMUNITY)
Admission: RE | Admit: 2012-01-14 | Discharge: 2012-01-14 | Disposition: A | Discharge status: Home / Self Care | Payer: BC Managed Care – PPO | Source: Ambulatory Visit | Attending: Surgery | Admitting: Surgery

## 2012-01-14 ENCOUNTER — Encounter (HOSPITAL_COMMUNITY)
Admission: RE | Disposition: A | Discharge status: Home / Self Care | Payer: Self-pay | Source: Ambulatory Visit | Attending: Surgery

## 2012-01-14 ENCOUNTER — Ambulatory Visit (HOSPITAL_COMMUNITY): Payer: BC Managed Care – PPO | Admitting: Anesthesiology

## 2012-01-14 DIAGNOSIS — E039 Hypothyroidism, unspecified: Secondary | ICD-10-CM | POA: Insufficient documentation

## 2012-01-14 DIAGNOSIS — Z79899 Other long term (current) drug therapy: Secondary | ICD-10-CM | POA: Insufficient documentation

## 2012-01-14 DIAGNOSIS — K648 Other hemorrhoids: Secondary | ICD-10-CM

## 2012-01-14 DIAGNOSIS — I10 Essential (primary) hypertension: Secondary | ICD-10-CM | POA: Insufficient documentation

## 2012-01-14 DIAGNOSIS — K644 Residual hemorrhoidal skin tags: Secondary | ICD-10-CM

## 2012-01-14 DIAGNOSIS — R198 Other specified symptoms and signs involving the digestive system and abdomen: Secondary | ICD-10-CM | POA: Insufficient documentation

## 2012-01-14 DIAGNOSIS — E785 Hyperlipidemia, unspecified: Secondary | ICD-10-CM | POA: Insufficient documentation

## 2012-01-14 SURGERY — EXAM UNDER ANESTHESIA WITH HEMORRHOIDECTOMY
Anesthesia: General | Site: Anus | Wound class: Contaminated

## 2012-01-14 MED ORDER — SUCCINYLCHOLINE CHLORIDE 20 MG/ML IJ SOLN
INTRAMUSCULAR | Status: DC | PRN
  Administered 2012-01-14: 20 mg via INTRAVENOUS
  Administered 2012-01-14: 100 mg via INTRAVENOUS

## 2012-01-14 MED ORDER — LEVOTHYROXINE SODIUM 75 MCG PO TABS
75.0000 ug | ORAL_TABLET | Freq: Every day | ORAL | Status: DC
  Filled 2012-01-14: qty 1

## 2012-01-14 MED ORDER — FENTANYL CITRATE 0.05 MG/ML IJ SOLN
INTRAMUSCULAR | Status: DC | PRN
  Administered 2012-01-14: 50 ug via INTRAVENOUS

## 2012-01-14 MED ORDER — 0.9 % SODIUM CHLORIDE (POUR BTL) OPTIME
TOPICAL | Status: DC | PRN
  Administered 2012-01-14: 1000 mL

## 2012-01-14 MED ORDER — EPHEDRINE SULFATE 50 MG/ML IJ SOLN
INTRAMUSCULAR | Status: DC | PRN
  Administered 2012-01-14 (×3): 5 mg via INTRAVENOUS

## 2012-01-14 MED ORDER — PROPOFOL 10 MG/ML IV BOLUS
INTRAVENOUS | Status: DC | PRN
  Administered 2012-01-14: 20 mg via INTRAVENOUS
  Administered 2012-01-14: 180 mg via INTRAVENOUS

## 2012-01-14 MED ORDER — FENTANYL CITRATE 0.05 MG/ML IJ SOLN: 25.0000 ug | INTRAMUSCULAR | Status: DC | PRN

## 2012-01-14 MED ORDER — ACETAMINOPHEN 650 MG RE SUPP
650.0000 mg | RECTAL | Status: DC | PRN
  Filled 2012-01-14: qty 1

## 2012-01-14 MED ORDER — MIDAZOLAM HCL 5 MG/5ML IJ SOLN
INTRAMUSCULAR | Status: DC | PRN
  Administered 2012-01-14: 2 mg via INTRAVENOUS

## 2012-01-14 MED ORDER — CEFOXITIN SODIUM-DEXTROSE 1-4 GM-% IV SOLR (PREMIX)
INTRAVENOUS | Status: AC
  Filled 2012-01-14: qty 100

## 2012-01-14 MED ORDER — ONDANSETRON HCL 4 MG/2ML IJ SOLN: 4.0000 mg | Freq: Four times a day (QID) | INTRAMUSCULAR | Status: DC | PRN

## 2012-01-14 MED ORDER — SODIUM CHLORIDE 0.9 % IJ SOLN: 3.0000 mL | INTRAMUSCULAR | Status: DC | PRN

## 2012-01-14 MED ORDER — LIDOCAINE HCL 1 % IJ SOLN
INTRAMUSCULAR | Status: DC | PRN
  Administered 2012-01-14 (×2): 50 mg via INTRADERMAL

## 2012-01-14 MED ORDER — SODIUM CHLORIDE 0.9 % IJ SOLN: 3.0000 mL | Freq: Two times a day (BID) | INTRAMUSCULAR | Status: DC

## 2012-01-14 MED ORDER — BUPIVACAINE LIPOSOME 1.3 % IJ SUSP
20.0000 mL | INTRAMUSCULAR | Status: AC
  Administered 2012-01-14: 20 mL
  Filled 2012-01-14: qty 20

## 2012-01-14 MED ORDER — ONDANSETRON HCL 4 MG/2ML IJ SOLN
INTRAMUSCULAR | Status: AC
  Filled 2012-01-14: qty 2

## 2012-01-14 MED ORDER — LACTATED RINGERS IV SOLN
INTRAVENOUS | Status: DC | PRN
  Administered 2012-01-14: 07:00:00 via INTRAVENOUS

## 2012-01-14 MED ORDER — TAMSULOSIN HCL 0.4 MG PO CAPS
0.4000 mg | ORAL_CAPSULE | Freq: Every day | ORAL | Status: DC
  Filled 2012-01-14 (×2): qty 1

## 2012-01-14 MED ORDER — KETOROLAC TROMETHAMINE 30 MG/ML IJ SOLN: 15.0000 mg | Freq: Once | INTRAMUSCULAR | Status: DC | PRN

## 2012-01-14 MED ORDER — SODIUM CHLORIDE 0.9 % IV SOLN: 250.0000 mL | INTRAVENOUS | Status: DC | PRN

## 2012-01-14 MED ORDER — OXYCODONE HCL 5 MG PO TABS
5.0000 mg | ORAL_TABLET | ORAL | Status: DC | PRN
Start: 2012-01-14 — End: 2012-01-14

## 2012-01-14 MED ORDER — DIBUCAINE 1 % RE OINT
TOPICAL_OINTMENT | RECTAL | Status: AC
  Filled 2012-01-14: qty 28

## 2012-01-14 MED ORDER — OXYCODONE HCL 5 MG PO TABS: 5.0000 mg | ORAL_TABLET | ORAL | Status: AC | PRN

## 2012-01-14 MED ORDER — ACETAMINOPHEN 325 MG PO TABS: 650.0000 mg | ORAL_TABLET | ORAL | Status: DC | PRN

## 2012-01-14 MED ORDER — PROMETHAZINE HCL 25 MG/ML IJ SOLN: 6.2500 mg | INTRAMUSCULAR | Status: DC | PRN

## 2012-01-14 MED ORDER — ACETAMINOPHEN 10 MG/ML IV SOLN
INTRAVENOUS | Status: AC
  Filled 2012-01-14: qty 100

## 2012-01-14 MED ORDER — ASPIRIN 81 MG PO CHEW
81.0000 mg | CHEWABLE_TABLET | Freq: Every evening | ORAL | Status: DC
  Filled 2012-01-14 (×2): qty 1

## 2012-01-14 MED ORDER — ACETAMINOPHEN 10 MG/ML IV SOLN
INTRAVENOUS | Status: DC | PRN
  Administered 2012-01-14: 1000 mg via INTRAVENOUS

## 2012-01-14 MED ORDER — DEXTROSE 5 % IV SOLN
2.0000 g | INTRAVENOUS | Status: AC
  Administered 2012-01-14: 2 g via INTRAVENOUS
  Filled 2012-01-14: qty 2

## 2012-01-14 MED ORDER — BUPIVACAINE-EPINEPHRINE 0.25% -1:200000 IJ SOLN
INTRAMUSCULAR | Status: AC
  Filled 2012-01-14: qty 1

## 2012-01-14 MED ORDER — ATORVASTATIN CALCIUM 10 MG PO TABS
10.0000 mg | ORAL_TABLET | Freq: Every evening | ORAL | Status: DC
  Filled 2012-01-14 (×2): qty 1

## 2012-01-14 SURGICAL SUPPLY — 37 items
BLADE HEX COATED 2.75 (ELECTRODE) ×2 IMPLANT
BLADE SURG 15 STRL LF DISP TIS (BLADE) ×1 IMPLANT
BLADE SURG 15 STRL SS (BLADE) ×1
CANISTER SUCTION 2500CC (MISCELLANEOUS) ×2 IMPLANT
CLOTH BEACON ORANGE TIMEOUT ST (SAFETY) ×2 IMPLANT
DECANTER SPIKE VIAL GLASS SM (MISCELLANEOUS) ×2 IMPLANT
DRAPE LAPAROTOMY T 102X78X121 (DRAPES) ×2 IMPLANT
DRAPE LG THREE QUARTER DISP (DRAPES) ×2 IMPLANT
DRAPE TABLE BACK 44X90 PK DISP (DRAPES) ×2 IMPLANT
DRSG PAD ABDOMINAL 8X10 ST (GAUZE/BANDAGES/DRESSINGS) IMPLANT
ELECT NEEDLE TIP 2.8 STRL (NEEDLE) ×2 IMPLANT
ELECT REM PT RETURN 9FT ADLT (ELECTROSURGICAL) ×2
ELECTRODE REM PT RTRN 9FT ADLT (ELECTROSURGICAL) ×1 IMPLANT
GAUZE SPONGE 4X4 16PLY XRAY LF (GAUZE/BANDAGES/DRESSINGS) ×2 IMPLANT
GLOVE BIOGEL PI IND STRL 7.0 (GLOVE) ×1 IMPLANT
GLOVE BIOGEL PI INDICATOR 7.0 (GLOVE) ×1
GLOVE ECLIPSE 8.0 STRL XLNG CF (GLOVE) ×2 IMPLANT
GLOVE INDICATOR 8.0 STRL GRN (GLOVE) ×2 IMPLANT
GOWN STRL NON-REIN LRG LVL3 (GOWN DISPOSABLE) ×2 IMPLANT
GOWN STRL REIN XL XLG (GOWN DISPOSABLE) ×4 IMPLANT
HEMOSTAT SURGICEL 2X14 (HEMOSTASIS) IMPLANT
KIT BASIN OR (CUSTOM PROCEDURE TRAY) ×2 IMPLANT
LUBRICANT JELLY K Y 4OZ (MISCELLANEOUS) ×2 IMPLANT
NDL SAFETY ECLIPSE 18X1.5 (NEEDLE) IMPLANT
NEEDLE HYPO 18GX1.5 SHARP (NEEDLE)
NEEDLE HYPO 22GX1.5 SAFETY (NEEDLE) ×2 IMPLANT
NS IRRIG 1000ML POUR BTL (IV SOLUTION) ×2 IMPLANT
PENCIL BUTTON HOLSTER BLD 10FT (ELECTRODE) ×2 IMPLANT
SPONGE GAUZE 4X4 12PLY (GAUZE/BANDAGES/DRESSINGS) ×2 IMPLANT
SPONGE SURGIFOAM ABS GEL 12-7 (HEMOSTASIS) IMPLANT
SUT CHROMIC 2 0 SH (SUTURE) IMPLANT
SUT CHROMIC 3 0 SH 27 (SUTURE) IMPLANT
SUT VIC AB 2-0 SH 27 (SUTURE) ×2
SUT VIC AB 2-0 SH 27X BRD (SUTURE) ×2 IMPLANT
SYR CONTROL 10ML LL (SYRINGE) ×2 IMPLANT
TOWEL OR 17X26 10 PK STRL BLUE (TOWEL DISPOSABLE) ×2 IMPLANT
YANKAUER SUCT BULB TIP 10FT TU (MISCELLANEOUS) ×2 IMPLANT

## 2012-01-14 NOTE — Anesthesia Postprocedure Evaluation (Signed)
  Anesthesia Post-op Note  Patient: Ryan Townsend  Procedure(s) Performed: Procedure(s) (LRB): EXAM UNDER ANESTHESIA WITH HEMORRHOIDECTOMY (N/A)  Patient Location: PACU  Anesthesia Type: General  Level of Consciousness: awake and alert   Airway and Oxygen Therapy: Patient Spontanous Breathing  Post-op Pain: mild  Post-op Assessment: Post-op Vital signs reviewed, Patient's Cardiovascular Status Stable, Respiratory Function Stable, Patent Airway and No signs of Nausea or vomiting  Post-op Vital Signs: stable  Complications: No apparent anesthesia complications

## 2012-01-14 NOTE — Transfer of Care (Signed)
Immediate Anesthesia Transfer of Care Note  Patient: Ryan Townsend  Procedure(s) Performed: Procedure(s) (LRB) with comments: EXAM UNDER ANESTHESIA WITH HEMORRHOIDECTOMY (N/A) - exam under anesthesia, external hemorroidectomy  Patient Location: PACU  Anesthesia Type: General  Level of Consciousness: awake, alert  and oriented  Airway & Oxygen Therapy: Patient Spontanous Breathing and Patient connected to face mask oxygen  Post-op Assessment: Report given to PACU RN and Post -op Vital signs reviewed and stable  Post vital signs: Reviewed and stable  Complications: No apparent anesthesia complications

## 2012-01-14 NOTE — H&P (Signed)
Ryan Townsend is an 66 y.o. male.   Chief Complaint: Hemorrhoids  HPI: Patient Care Team:  Linna Hoff, MD as PCP - General (Family Medicine)  Griffith Citron, MD as Consulting Physician (Gastroenterology)  This patient is a 66 y.o.male who presents today for surgical evaluation at the request of Dr. Artis Flock.   Reason for visit: Question about hemorrhoid surgery   Patient is a pleasant gentleman who has struggled with hemorrhoids for many decades. He had hemorrhoidectomy in 1982. He try banding before that. He was sore but he recovered. He recalls that they could not do it all at once. He was disappointed in that. He has been taking Metamucil every day since. He thinks he had anal warts removed in the late 70s as well. No recurrence or treatment since  A few years ago, he was diagnosed with pruritus ani. He was placed on hydrocortisone cream and that seemed to help. In October 2012 he was noted to have some bright red blood. He underwent colonoscopy. Dr. Kinnie Scales thought he had some enlarged hemorrhoids. He ended up having office banding done by Dr. Kinnie Scales x 3 visits. The patient notes he was getting persistent itching and flares. It gradually seemed to worsen. Occasionally notes anal discharge. Underwear often lightly stained. He feels like his bowel movements are not as good since the colonoscopy. Some occasional fecal urgency. He went to [place] for a month, suppositories seemed to control it OK. However he finds that the area is starting to itch & get irritated more. He stopped Metamucil and did not notice any different. He had been feeling gassy.  I had recommended external hemorrhoidectomy. He comes today with more questions. He does note he had urinary retention with his hemorrhoidectomy 1982 and wondered if that was going to be an issue. Denies any difficulty with urination no prostate problems. No hesitancy. He restarted the Metamucil but felt like it was getting too soft to back off. He wanted to  know what options he had he does note he is transitioned over to a more high fiber diet and has a salad at dinner time.  No new events   Past Medical History  Diagnosis Date  . Hyperlipidemia   . Hypertension   . Thyroid disease   . Varicose vein   . Anal warts 1979  . Hemorrhoids   . Hypothyroidism   . Nocturia     Past Surgical History  Procedure Date  . Hemorroidectomy 1982  . Removal of anal warts 1979  . Hemorroid banding 2012    Family History  Problem Relation Age of Onset  . Cancer Mother     colon  . Hypertension Mother   . Cancer Sister     lung  . Kidney disease Sister    Social History:  reports that he quit smoking about 35 years ago. His smoking use included Cigarettes. He has a 10 pack-year smoking history. He has never used smokeless tobacco. He reports that he drinks alcohol. He reports that he does not use illicit drugs.  Allergies: No Known Allergies  Medications Prior to Admission  Medication Sig Dispense Refill  . aspirin 81 MG tablet Take 81 mg by mouth every evening.       Marland Kitchen atorvastatin (LIPITOR) 10 MG tablet Take 10 mg by mouth every evening.       Marland Kitchen DIOVAN 160 MG tablet Take 160 mg by mouth every morning.       Marland Kitchen SYNTHROID 75 MCG tablet Take  75 mcg by mouth every morning.       . Tamsulosin HCl (FLOMAX) 0.4 MG CAPS Take 0.4 mg by mouth daily after supper.      . terbinafine (LAMISIL) 1 % cream Apply topically every evening. To right shoulder area      . NASONEX 50 MCG/ACT nasal spray         No results found for this or any previous visit (from the past 48 hour(s)). No results found.  Review of Systems  Constitutional: Negative for fever, chills, weight loss and malaise/fatigue.  HENT: Negative for neck pain and ear discharge.   Eyes: Negative for double vision and photophobia.  Respiratory: Negative for cough and shortness of breath.   Cardiovascular: Negative for chest pain and palpitations.  Gastrointestinal: Negative for heartburn,  nausea and constipation.  Genitourinary: Negative for dysuria and urgency.  Musculoskeletal: Negative for myalgias and falls.  Skin: Negative for itching and rash.  Neurological: Negative for dizziness, speech change and headaches.  Endo/Heme/Allergies: Negative for polydipsia. Does not bruise/bleed easily.  Psychiatric/Behavioral: Negative for suicidal ideas and substance abuse.    Blood pressure 139/87, pulse 86, temperature 97.3 F (36.3 C), resp. rate 20, SpO2 96.00%. Physical Exam  Constitutional: He is oriented to person, place, and time. He appears well-developed and well-nourished.  HENT:  Head: Normocephalic.  Mouth/Throat: Oropharynx is clear and moist.  Eyes: Conjunctivae and EOM are normal. Pupils are equal, round, and reactive to light. No scleral icterus.  Neck: Normal range of motion. Neck supple. No tracheal deviation present.  Cardiovascular: Normal rate and intact distal pulses.   Respiratory: Effort normal. No respiratory distress.  GI: Soft. He exhibits no distension. There is no tenderness.  Genitourinary: Penis normal. No penile tenderness.  Musculoskeletal: Normal range of motion. He exhibits no tenderness.  Neurological: He is alert and oriented to person, place, and time. No cranial nerve deficit.  Skin: Skin is warm and dry. No erythema.  Psychiatric: He has a normal mood and affect. His behavior is normal.     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 I noted I would definitely remove the left and probably one on the right. I cautioned that I don't want to be over aggressive to avoid stricturing. He was worried about the risk of incontinence. I noted soiling & bleeding is common but severe incontinence is extremely rare since removing superficial to the sphincters. He felt reassured.   The anatomy & physiology of the anorectal region was discussed. The pathophysiology of hemorrhoids  and differential diagnosis was discussed. Natural history risks without surgery was discussed. I stressed the importance of a bowel regimen to have daily soft bowel movements to minimize progression of disease. Interventions such as sclerotherapy & banding were discussed but the external location makes these options not tolerable in an outpatient setting.   The patient's symptoms are not adequately controlled by medicines and other non-operative treatments. I feel the risks & problems of no surgery outweigh the operative risks; therefore, I recommended surgery to treat the hemorrhoids by resection. i do not think that he requires [facility] as this is more a local external hemorrhoid issue. Risks such as bleeding, infection, need for further treatment, heart attack, death, and other risks were discussed. I noted a good likelihood this will help address the problem. Goals of post-operative recovery were discussed as well. Possibility that this will not correct all symptoms was explained. Post-operative  pain, bleeding, constipation, and other problems after surgery were discussed. We will work to minimize complications. Educational handouts further explaining the pathology, treatment options, and bowel regimen were given as well. Questions were answered. The patient expresses understanding & wishes to consider to proceed with surgery. He will call us.   Given the fact he had urinary retention with the last hemorrhoidectomy, I offered to have him be on Flomax perioperatively to minimize risk of urinary retention. He is open that idea. We wrote prescription.  I have re-reviewed the the patient's records, history, medications, and allergies.  I have re-examined the patient.  I again discussed intraoperative plans and goals of post-operative recovery.  The patient agrees to proceed.      Lucie Friedlander C. 01/14/2012, 7:05 AM

## 2012-01-14 NOTE — Op Note (Signed)
01/14/2012  8:10 AM  PATIENT:  Ryan Townsend  66 y.o. 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  INDICATION: Pleasant male with a history of anal warts several decades ago.  He had an external hemorrhoidectomy in 1982.  He's been following made for these masses.  I recommended excision.  All circumflex and an examination under anesthesia:  The anatomy & physiology of the anorectal region was discussed.  The pathophysiology of hemorrhoids and differential diagnosis was discussed.  Natural history risks without surgery was discussed.   I stressed the importance of a bowel regimen to have daily soft bowel movements to minimize progression of disease.  Interventions such as sclerotherapy & banding were discussed.  The patient's symptoms are not adequately controlled by medicines and other non-operative treatments.  I feel the risks & problems of no surgery outweigh the operative risks; therefore, I recommended surgery to treat the hemorrhoids by ligation, pexy, and possible resection.  Risks such as bleeding, infection, need for  further treatment, heart attack, death, and other risks were discussed.   I noted a good likelihood this will help address the problem.  Goals of post-operative recovery were discussed as well.  Possibility that this will not correct all symptoms was explained.  Post-operative pain, bleeding, constipation, and other problems after surgery were discussed.  We will work to minimize complications.   Educational handouts further explaining the pathology, treatment options, and bowel regimen were given as well.  Questions were answered.  The patient expresses understanding & wishes to proceed with surgery.   OR FINDINGS: External perianal masses left posterior lateral and anterior midline.  Suspicious for old external hemorrhoids although not in classic location.  Anterior anal canal mucosal irregular mass.  Very small and superficial.  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  Ablated.  DESCRIPTION: Informed consent was confirmed. Patient underwent general anesthesia without difficulty. Patient was placed into prone positioning.  The perianal region was prepped and draped in sterile fashion. Surgical tunnel confirmed or plan.  I did digital rectal examination and then transitioned over to anoscopy to get a sense of the anatomy.  He had some irregular external perianal masses.  No suspicious for external hemorrhoids.  I decided to excise him.  I elevated the left posterior lateral perianal mass 3x2cm.  I excised it in a radial direction taking an ellipse wound.  I cauterized the base.  I closed the wound radially using 2-0 Vicryl subcuticular stitch.  I excised a similar 2cm mass in the anterior midline going on up to the midline raphae.  That wound was only about 1 cm total.  In the anterior midline there  was some irregularity to the mucosa.  Suspicious for very small superficial anal condyloma.  I cauterized it with needle-tip point cautery.  2 x 2 centimeter region was about six 1-72mm lesions total.  I repeated  anoscopy and examination.  Hemostasis was good.  Patient is being extubated go to recovery room.  I am about to discuss the patient's status to the family.

## 2012-01-14 NOTE — Anesthesia Preprocedure Evaluation (Signed)
Anesthesia Evaluation  Patient identified by MRN, date of birth, ID band Patient awake    Reviewed: Allergy & Precautions, H&P , NPO status , Patient's Chart, lab work & pertinent test results  Airway Mallampati: II TM Distance: <3 FB Neck ROM: Full    Dental No notable dental hx.    Pulmonary neg pulmonary ROS,  breath sounds clear to auscultation  Pulmonary exam normal       Cardiovascular hypertension, Rhythm:Regular Rate:Normal     Neuro/Psych negative neurological ROS  negative psych ROS   GI/Hepatic negative GI ROS, Neg liver ROS,   Endo/Other  Hypothyroidism   Renal/GU negative Renal ROS  negative genitourinary   Musculoskeletal negative musculoskeletal ROS (+)   Abdominal   Peds negative pediatric ROS (+)  Hematology negative hematology ROS (+)   Anesthesia Other Findings   Reproductive/Obstetrics negative OB ROS                           Anesthesia Physical Anesthesia Plan  ASA: II  Anesthesia Plan: General   Post-op Pain Management:    Induction: Intravenous  Airway Management Planned: LMA and Oral ETT  Additional Equipment:   Intra-op Plan:   Post-operative Plan: Extubation in OR  Informed Consent: I have reviewed the patients History and Physical, chart, labs and discussed the procedure including the risks, benefits and alternatives for the proposed anesthesia with the patient or authorized representative who has indicated his/her understanding and acceptance.   Dental advisory given  Plan Discussed with: CRNA and Surgeon  Anesthesia Plan Comments:         Anesthesia Quick Evaluation

## 2012-01-18 ENCOUNTER — Telehealth (INDEPENDENT_AMBULATORY_CARE_PROVIDER_SITE_OTHER): Payer: Self-pay

## 2012-01-18 NOTE — Telephone Encounter (Signed)
Called pt to let him know that his path was benign hemorrhoids per Dr Michaell Cowing. The pt is doing fine.

## 2012-01-27 ENCOUNTER — Encounter (INDEPENDENT_AMBULATORY_CARE_PROVIDER_SITE_OTHER): Payer: Self-pay

## 2012-01-27 ENCOUNTER — Ambulatory Visit (INDEPENDENT_AMBULATORY_CARE_PROVIDER_SITE_OTHER): Payer: BC Managed Care – PPO | Admitting: Surgery

## 2012-01-27 ENCOUNTER — Encounter (INDEPENDENT_AMBULATORY_CARE_PROVIDER_SITE_OTHER): Payer: Self-pay | Admitting: Surgery

## 2012-01-27 VITALS — BP 134/78 | HR 55 | Resp 16 | Ht 69.0 in | Wt 174.2 lb

## 2012-01-27 DIAGNOSIS — K644 Residual hemorrhoidal skin tags: Secondary | ICD-10-CM

## 2012-01-27 DIAGNOSIS — A630 Anogenital (venereal) warts: Secondary | ICD-10-CM

## 2012-01-27 NOTE — Progress Notes (Signed)
Subjective:     Patient ID: Ryan Townsend, male   DOB: December 07, 1945, 66 y.o.   MRN: [id number]  HPI  Payam Zero  10-09-1945 [id number]  Patient Care Team: Linna Hoff, MD as PCP - General (Family Medicine) Griffith Citron, MD as Consulting Physician (Gastroenterology)  This patient is a 66 y.o.male who presents today for surgical evaluation s/p removal of hemorrhoids & ablation of possible small warts.  Patient comes in today feeling well.  Soreness going down.  Bleeding away.  Starting to sit more.  Be more active.  Still feels some discomfort at times.  Energy level overall good.  He seems happy with how he is recovered so far.  Back to his usual twice a day bowel movements.  Taking a fiber supplement.   FINAL DIAGNOSIS Diagnosis 1. Hemorrhoids, anal mass left posterior lateral - HYPERTROPHIC SQUAMOUS MUCOSA WITH UNDERLYING TISSUE, CONSISTENT WITH HEMORRHOID TISSUE. 2. Hemorrhoids, anal mass anterior midline - HYPERTROPHIC SQUAMOUS MUCOSA WITH UNDERLYING TISSUE, CONSISTENT WITH HEMORRHOID TISSUE. Abigail Miyamoto MD Pathologist, Electronic Signature (Case signed 01/15/2012)  Patient Active Problem List  Diagnosis  . HYPOTHYROIDISM  . HYPERLIPIDEMIA  . HYPERTENSION  . LOW BACK PAIN, CHRONIC  . COLONIC POLYPS, HX OF  . External hemorrhoids with pain & itching  . H/O urinary retention after hemorrhoidectomy 1982    Past Medical History  Diagnosis Date  . Hyperlipidemia   . Hypertension   . Thyroid disease   . Varicose vein   . Anal warts 1979  . Hemorrhoids   . Hypothyroidism   . Nocturia     Past Surgical History  Procedure Date  . Hemorroidectomy 1982  . Removal of anal warts 1979  . Hemorroid banding 2012    History   Social History  . Marital Status: Married    Spouse Name: N/A    Number of Children: N/A  . Years of Education: N/A   Occupational History  . Education administrator   Social History Main Topics  . Smoking  status: Former Smoker -- 1.0 packs/day for 10 years    Types: Cigarettes    Quit date: 11/01/1976  . Smokeless tobacco: Never Used  . Alcohol Use: Yes     occ  . Drug Use: No  . Sexually Active:    Other Topics Concern  . Not on file   Social History Narrative  . No narrative on file    Family History  Problem Relation Age of Onset  . Cancer Mother     colon  . Hypertension Mother   . Cancer Sister     lung  . Kidney disease Sister     Current Outpatient Prescriptions  Medication Sig Dispense Refill  . aspirin 81 MG tablet Take 81 mg by mouth every evening.       Marland Kitchen atorvastatin (LIPITOR) 10 MG tablet Take 10 mg by mouth every evening.       Marland Kitchen DIOVAN 160 MG tablet Take 160 mg by mouth every morning.       Marland Kitchen SYNTHROID 75 MCG tablet Take 75 mcg by mouth every morning.       . terbinafine (LAMISIL) 1 % cream Apply topically every evening. To right shoulder area      . NASONEX 50 MCG/ACT nasal spray       . Tamsulosin HCl (FLOMAX) 0.4 MG CAPS Take 0.4 mg by mouth daily after supper.         No  Known Allergies  BP 134/78  Pulse 55  Resp 16  Ht 5\' 9"  (1.753 m)  Wt 174 lb 3.2 oz (79.017 kg)  BMI 25.72 kg/m2  SpO2 98%  Dg Chest 2 View  12/31/2011  *RADIOLOGY REPORT*  Clinical Data: Preop exam.  CHEST - 2 VIEW  Comparison: None  Findings: The heart size and mediastinal contours are within normal limits.  Both lungs are clear.  The visualized skeletal structures are unremarkable.  IMPRESSION: Negative exam.   Original Report Authenticated By: Rosealee Albee, M.D.      Review of Systems  Constitutional: Negative for fever, chills and diaphoresis.  HENT: Negative for sore throat, trouble swallowing and neck pain.   Eyes: Negative for photophobia and visual disturbance.  Respiratory: Negative for choking and shortness of breath.   Cardiovascular: Negative for chest pain and palpitations.  Gastrointestinal: Negative for nausea, vomiting, abdominal distention, anal  bleeding and rectal pain.  Genitourinary: Negative for dysuria, urgency, difficulty urinating and testicular pain.  Musculoskeletal: Negative for myalgias, arthralgias and gait problem.  Skin: Negative for color change and rash.  Neurological: Negative for dizziness, speech difficulty, weakness and numbness.  Hematological: Negative for adenopathy.  Psychiatric/Behavioral: Negative for hallucinations, confusion and agitation.       Objective:   Physical Exam  Constitutional: He is oriented to person, place, and time. He appears well-developed and well-nourished. No distress.  HENT:  Head: Normocephalic.  Mouth/Throat: Oropharynx is clear and moist. No oropharyngeal exudate.  Eyes: Conjunctivae normal and EOM are normal. Pupils are equal, round, and reactive to light. No scleral icterus.  Neck: Normal range of motion. No tracheal deviation present.  Cardiovascular: Normal rate, normal heart sounds and intact distal pulses.   Pulmonary/Chest: Effort normal. No respiratory distress.  Abdominal: Soft. He exhibits no distension. There is no tenderness. Hernia confirmed negative in the right inguinal area and confirmed negative in the left inguinal area.       Incisions clean with normal healing ridges.  No hernias  Genitourinary:       Exam done with assistance of male Medical Assistant in the room.  Perianal skin clean with good hygiene.  No pruritis.  R anterior & Left post incisions healing w normal ridges - no infection.  No external skin tags / hemorrhoids of significance.  No pilonidal disease.  No fissure.  No abscess/fistula.  Normal sphincter tone.    Musculoskeletal: Normal range of motion. He exhibits no tenderness.  Neurological: He is alert and oriented to person, place, and time. No cranial nerve deficit. He exhibits normal muscle tone. Coordination normal.  Skin: Skin is warm and dry. No rash noted. He is not diaphoretic.  Psychiatric: He has a normal mood and affect. His  behavior is normal.       Assessment:     S/p removed of anal masses c/w hemorrhoids.  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  Do not push through pain.  Advanced on diet as tolerated. Bowel regimen to avoid problems.  Return to clinic in 3-82months for anoscopy to see if any evidence of recurrence of condyloma.. The patient expressed understanding and appreciation

## 2012-01-27 NOTE — Patient Instructions (Signed)
HEMORRHOIDS   The rectum is the last few inches of your colon, and it naturally stretches to hold stool.  Hemorrhoidal piles are natural clusters of blood vessels that help the rectum stretch to hold stool and allow bowel movements to eliminate feces.  Hemorrhoids are abnormally swollen blood vessels in the rectum.  Too much pressure in the rectum causes hemorrhoids by forcing blood to stretch and bulge the walls of the veins, sometimes even rupturing them.  Hemorrhoids can become like varicose veins you might see on a person's legs. When bulging hemorrhoidal veins are irritated, they can swell, burn, itch, become very painful, and bleed. Once the rectal veins have been stretched out and hemorrhoids created, they are difficult to get rid of completely and tend to recur with less straining than it took to cause them in the first place. Fortunately, good habits and simple medical treatment usually control hemorrhoids well, and surgery is only recommended in unusually severe cases. Some of the most frequent causes of hemorrhoids:    Constant sitting    Straining with bowel movements (from constipation or hard stools)    Diarrhea    Sitting on the toilet for a long time    Severe coughing    Childbirth    Heavy Lifting  Types of Hemorrhoids:    Internal hemorrhoids usually don't hurt or itch; they are deep inside the rectum and usually have no sensation. However, internal hemorrhoids can bleed.  Such bleeding should not be ignored and mask blood from a dangerous source like colorectal cancer, so persistent rectal bleeding should be investigated with a colonoscopy.    External hemorrhoids cause most of the symptoms - pain, burning, and itching. Unirritated hemorrhoids can look like small skin tags coming out of the anus.     Thrombosed hemorrhoids can form when a hemorrhoid blood vessel bursts and causes the hemorrhoid to swell.  A purple blood clot can form in it and become an excruciatingly painful lump  at the anus. Because of these unpleasant symptoms, immediate incision and drainage by a surgeon at an office visit can provide much relief of the pain.    PREVENTION Avoiding the causes listed in above will prevent most cases of hemorrhoids, but this advice is sometimes hard to follow:  How can you avoid sitting all day if you have a seated job? Also, we try to avoid coughing and diarrhea, but sometimes it's beyond your control.  Still, there are some practical hints to help:    If your main job activity is seated, always stand or walk during your breaks. Make it a point to stand and walk at least 5 minutes every hour and try to shift frequently in your chair to avoid direct rectal pressure.    Always exhale as you strain or lift. Don't hold your breath.    Treat coughing, diarrhea and constipation early since irritated hemorrhoids may soon follow.    Do not delay or try to prevent a bowel movement when the urge is present.   Exercise regularly (walking or jogging 60 minutes a day) to stimulate the bowels to move.   Avoid dry toilet paper when cleaning after bowel movements.  Moistened tissues such as baby wipes are less irritating.  Lightly pat the rectal area dry.  Using irrigating showers or bottle irrigation washing can more gently clean this sensitive area.   Keep the anal and genital area clean and  dry.  Talcum or baby powders can help   GET   YOUR STOOLS SOFT.   This is the most important way to prevent irritated hemorrhoids.  Hard stools are like sandpaper to the anorectal canal and will cause more problems.   The goal: ONE SOFT BOWEL MOVEMENT A DAY!  To have soft, regular bowel movements:    Drink at least 8 tall glasses of water a day.     AVOID CONSTIPATION    Take plenty of fiber.  Fiber is the undigested part of plant food that passes into the colon, acting s "natures broom" to encourage bowel motility and movement.  Fiber can absorb and hold large amounts of water. This results in a  larger, bulkier stool, which is soft and easier to pass. Work gradually over several weeks up to 6 servings a day of fiber (25g a day even more if needed) in the form of: o Vegetables -- Root (potatoes, carrots, turnips), leafy green (lettuce, salad greens, celery, spinach), or cooked high residue (cabbage, broccoli, etc) o Fruit -- Fresh (unpeeled skin & pulp), Dried (prunes, apricots, cherries, etc ),  or stewed ( applesauce)  o Whole grain breads, pasta, etc (whole wheat)  o Bran cereals    Bulking Agents -- This type of water-retaining fiber generally is easily obtained each day by one of the following:  o Psyllium bran -- The psyllium plant is remarkable because its ground seeds can retain so much water. This product is available as Metamucil, Konsyl, Effersyllium, Per Diem Fiber, or the less expensive generic preparation in drug and health food stores. Although labeled a laxative, it really is not a laxative.  o Methylcellulose -- This is another fiber derived from wood which also retains water. It is available as Citrucel. o Polyethylene Glycol - and "artificial" fiber commonly called Miralax or Glycolax.  It is helpful for people with gassy or bloated feelings with regular fiber o Flax Seed - a less gassy fiber than psyllium   No reading or other relaxing activity while on the toilet. If bowel movements take longer than 5 minutes, you are too constipated   Laxatives can be useful for a short period if constipation is severe o Osmotics (Milk of Magnesia, Fleets phosphosoda, Magnesium citrate, MiraLax, GoLytely) are safer than  o Stimulants (Senokot, Castor Oil, Dulcolax, Ex Lax)    o Do not take laxatives for more than 7days in a row.   Laxatives are not a good long-term solution as it can stress the intestine and colon and causes too much mineral and fluid losses.    If badly constipated, try a Bowel Retraining Program: o Do not use laxatives.  o Eat a diet high in roughage, such as bran  cereals and leafy vegetables.  o Drink six (6) ounces of prune or apricot juice each morning.  o Eat two (2) large servings of stewed fruit each day.  o Take one (1) heaping dose of a bulking agent (ex. Metamucil, Citrucel, Miralax) twice a day.  o Use sugar-free sweetener when possible to avoid excessive calories.  o Eat a normal breakfast.  o Set aside 15 minutes after breakfast to sit on the toilet, but do not strain to have a bowel movement.  o If you do not have a bowel movement by the third day, use an enema and repeat the above steps.    AVOID DIARRHEA o Switch to liquids and simpler foods for a few days to avoid stressing your intestines further. o Avoid dairy products (especially milk & ice cream) for a short   time.  The intestines often can lose the ability to digest lactose when stressed. o Avoid foods that cause gassiness or bloating.  Typical foods include beans and other legumes, cabbage, broccoli, and dairy foods.  Every person has some sensitivity to other foods, so listen to our body and avoid those foods that trigger problems for you. o Adding fiber (Citrucel, Metamucil, psyllium, Miralax) gradually can help thicken stools by absorbing excess fluid and retrain the intestines to act more normally.  Slowly increase the dose over a few weeks.  Too much fiber too soon can backfire and cause cramping & bloating. o Probiotics (such as active yogurt, Align, etc) may help repopulate the intestines and colon with normal bacteria and calm down a sensitive digestive tract.  Most studies show it to be of mild help, though, and such products can be costly. o Medicines:   Bismuth subsalicylate (ex. Kayopectate, Pepto Bismol) every 30 minutes for up to 6 doses can help control diarrhea.  Avoid if pregnant.   Loperamide (Immodium) can slow down diarrhea.  Start with two tablets (4mg  total) first and then try one tablet every 6 hours.  Avoid if you are having fevers or severe pain.  If you are not  better or start feeling worse, stop all medicines and call your doctor for advice o Call your doctor if you are getting worse or not better.  Sometimes further testing (cultures, endoscopy, X-ray studies, bloodwork, etc) may be needed to help diagnose and treat the cause of the diarrhea.   If these preventive measures fail, you must take action right away! Hemorrhoids are one condition that can be mild in the morning and become intolerable by nightfall.    Genital Warts Genital warts are a sexually transmitted infection. They may appear as small bumps on the tissues of the genital area. CAUSES  Genital warts are caused by a virus called human papillomavirus (HPV). HPV is the most common sexually transmitted disease (STD) and infection of the sex organs. This infection is spread by having unprotected sex with an infected person. It can be spread by vaginal, anal, and oral sex. Many people do not know they are infected. They may be infected for years without problems. However, even if they do not have problems, they can unknowingly pass the infection to their sexual partners. SYMPTOMS   Itching and irritation in the genital area.   Warts that bleed.   Painful sexual intercourse.  DIAGNOSIS  Warts are usually recognized with the naked eye on the vagina, vulva, perineum, anus, and rectum. Certain tests can also diagnose genital warts, such as:  A Pap test.   A tissue sample (biopsy) exam.   Colposcopy. A magnifying tool is used to examine the vagina and cervix. The HPV cells will change color when certain solutions are used.  TREATMENT  Warts can be removed by:  Applying certain chemicals, such as cantharidin or podophyllin.   Liquid nitrogen freezing (cryotherapy).   Immunotherapy with candida or trichophyton injections.   Laser treatment.   Burning with an electrified probe (electrocautery).   Interferon injections.   Surgery.  PREVENTION  HPV vaccination can help prevent  HPV infections that cause genital warts and that cause cancer of the cervix. It is recommended that the vaccination be given to people between the ages 31 to 60 years old. The vaccine might not work as well or might not work at all if you already have HPV. It should not be given to pregnant women. HOME  CARE INSTRUCTIONS   It is important to follow your caregiver's instructions. The warts will not go away without treatment. Repeat treatments are often needed to get rid of warts. Even after it appears that the warts are gone, the normal tissue underneath often remains infected.   Do not try to treat genital warts with medicine used to treat hand warts. This type of medicine is strong and can burn the skin in the genital area, causing more damage.   Tell your past and current sexual partner(s) that you have genital warts. They may be infected also and need treatment.   Avoid sexual contact while being treated.   Do not touch or scratch the warts. The infection may spread to other parts of your body.   Women with genital warts should have a cervical cancer check (Pap test) at least once a year. This type of cancer is slow-growing and can be cured if found early. Chances of developing cervical cancer are increased with HPV.   Inform your obstetrician about your warts in the event of pregnancy. This virus can be passed to the baby's respiratory tract. Discuss this with your caregiver.   Use a condom during sexual intercourse. Following treatment, the use of condoms will help prevent reinfection.   Ask your caregiver about using over-the-counter anti-itch creams.  SEEK MEDICAL CARE IF:   Your treated skin becomes red, swollen, or painful.   You have a fever.   You feel generally ill.   You feel little lumps in and around your genital area.   You are bleeding or have painful sexual intercourse.  MAKE SURE YOU:   Understand these instructions.   Will watch your condition.   Will get help  right away if you are not doing well or get worse.  Document Released: 04/24/2000 Document Revised: 04/16/2011 Document Reviewed: 11/03/2010 [facility] Patient Information 2012 [place], [place].

## 2012-02-18 ENCOUNTER — Encounter (INDEPENDENT_AMBULATORY_CARE_PROVIDER_SITE_OTHER): Payer: Self-pay | Admitting: Surgery

## 2012-02-18 ENCOUNTER — Ambulatory Visit (INDEPENDENT_AMBULATORY_CARE_PROVIDER_SITE_OTHER): Payer: BC Managed Care – PPO | Admitting: Surgery

## 2012-02-18 VITALS — BP 136/80 | HR 68 | Temp 97.7°F | Resp 18 | Ht 69.0 in | Wt 173.8 lb

## 2012-02-18 DIAGNOSIS — A630 Anogenital (venereal) warts: Secondary | ICD-10-CM

## 2012-02-18 DIAGNOSIS — K644 Residual hemorrhoidal skin tags: Secondary | ICD-10-CM

## 2012-02-18 NOTE — Patient Instructions (Signed)

## 2012-02-18 NOTE — Progress Notes (Signed)
Subjective:     Patient ID: Ryan Townsend, male   DOB: Nov 15, 1945, 66 y.o.   MRN: [id number]  HPI   Ryan Townsend  22-Apr-1946 [id number]  Patient Care Team: Linna Hoff, MD as PCP - General (Family Medicine) Griffith Citron, MD as Consulting Physician (Gastroenterology)  This patient is a 66 y.o.male who presents today for surgical evaluation s/p removal of hemorrhoids & ablation of possible small warts.  Patient comes in today feeling well.  Soreness going down.  Bleeding gone.  Sitting easily & more active.  Energy level overall good.  Twice a day bowel movements.  Washing away gently w beday.  Taking a fiber supplement.   He still feels a lump near the anus.  He was worried that things are not healing right and wished to be checked.  FINAL DIAGNOSIS Diagnosis 1. Hemorrhoids, anal mass left posterior lateral - HYPERTROPHIC SQUAMOUS MUCOSA WITH UNDERLYING TISSUE, CONSISTENT WITH HEMORRHOID TISSUE. 2. Hemorrhoids, anal mass anterior midline - HYPERTROPHIC SQUAMOUS MUCOSA WITH UNDERLYING TISSUE, CONSISTENT WITH HEMORRHOID TISSUE. Abigail Miyamoto MD Pathologist, Electronic Signature (Case signed 01/15/2012)  Patient Active Problem List  Diagnosis  . HYPOTHYROIDISM  . HYPERLIPIDEMIA  . HYPERTENSION  . LOW BACK PAIN, CHRONIC  . COLONIC POLYPS, HX OF  . External hemorrhoids with pain & itching  . H/O urinary retention after hemorrhoidectomy 1982  . Anal canal condylomata    Past Medical History  Diagnosis Date  . Hyperlipidemia   . Hypertension   . Thyroid disease   . Varicose vein   . Anal warts 1979  . Hemorrhoids   . Hypothyroidism   . Nocturia     Past Surgical History  Procedure Date  . Hemorroidectomy 1982  . Removal of anal warts 1979  . Hemorroid banding 2012    History   Social History  . Marital Status: Married    Spouse Name: N/A    Number of Children: N/A  . Years of Education: N/A   Occupational History  . Manufacturing systems engineer   Social History Main Topics  . Smoking status: Former Smoker -- 1.0 packs/day for 10 years    Types: Cigarettes    Quit date: 11/01/1976  . Smokeless tobacco: Never Used  . Alcohol Use: Yes     occ  . Drug Use: No  . Sexually Active:    Other Topics Concern  . Not on file   Social History Narrative  . No narrative on file    Family History  Problem Relation Age of Onset  . Cancer Mother     colon  . Hypertension Mother   . Cancer Sister     lung  . Kidney disease Sister     Current Outpatient Prescriptions  Medication Sig Dispense Refill  . aspirin 81 MG tablet Take 81 mg by mouth every evening.       Marland Kitchen atorvastatin (LIPITOR) 10 MG tablet Take 10 mg by mouth every evening.       Marland Kitchen DIOVAN 160 MG tablet Take 160 mg by mouth every morning.       Marland Kitchen SYNTHROID 75 MCG tablet Take 75 mcg by mouth every morning.       Marland Kitchen NASONEX 50 MCG/ACT nasal spray       . Tamsulosin HCl (FLOMAX) 0.4 MG CAPS Take 0.4 mg by mouth daily after supper.      . terbinafine (LAMISIL) 1 % cream Apply topically every evening. To right shoulder area  No Known Allergies  BP 136/80  Pulse 68  Temp 97.7 F (36.5 C) (Oral)  Resp 18  Ht 5\' 9"  (1.753 m)  Wt 173 lb 12.8 oz (78.835 kg)  BMI 25.67 kg/m2  Dg Chest 2 View  12/31/2011  *RADIOLOGY REPORT*  Clinical Data: Preop exam.  CHEST - 2 VIEW  Comparison: None  Findings: The heart size and mediastinal contours are within normal limits.  Both lungs are clear.  The visualized skeletal structures are unremarkable.  IMPRESSION: Negative exam.   Original Report Authenticated By: Rosealee Albee, M.D.      Review of Systems  Constitutional: Negative for fever, chills and diaphoresis.  HENT: Negative for sore throat, trouble swallowing and neck pain.   Eyes: Negative for photophobia and visual disturbance.  Respiratory: Negative for choking and shortness of breath.   Cardiovascular: Negative for chest pain and palpitations.    Gastrointestinal: Negative for nausea, vomiting, abdominal distention, anal bleeding and rectal pain.  Genitourinary: Negative for dysuria, urgency, difficulty urinating and testicular pain.  Musculoskeletal: Negative for myalgias, arthralgias and gait problem.  Skin: Negative for color change and rash.  Neurological: Negative for dizziness, speech difficulty, weakness and numbness.  Hematological: Negative for adenopathy.  Psychiatric/Behavioral: Negative for hallucinations, confusion and agitation.       Objective:   Physical Exam  Constitutional: He is oriented to person, place, and time. He appears well-developed and well-nourished. No distress.  HENT:  Head: Normocephalic.  Mouth/Throat: Oropharynx is clear and moist. No oropharyngeal exudate.  Eyes: Conjunctivae normal and EOM are normal. Pupils are equal, round, and reactive to light. No scleral icterus.  Neck: Normal range of motion. No tracheal deviation present.  Cardiovascular: Normal rate, normal heart sounds and intact distal pulses.   Pulmonary/Chest: Effort normal. No respiratory distress.  Abdominal: Soft. He exhibits no distension. There is no tenderness. Hernia confirmed negative in the right inguinal area and confirmed negative in the left inguinal area.       Incisions clean with normal healing ridges.  No hernias  Genitourinary:       Exam done with assistance of male Medical Assistant in the room.  Perianal skin clean with good hygiene.  No pruritis.  R anterior incision well healed.  Left post incision closed with mild elevated but normal ridge - no infection.  No external skin tags / hemorrhoids of significance.  No pilonidal disease.  No fissure.  No abscess/fistula.  Normal sphincter tone.    Musculoskeletal: Normal range of motion. He exhibits no tenderness.  Neurological: He is alert and oriented to person, place, and time. No cranial nerve deficit. He exhibits normal muscle tone. Coordination normal.   Skin: Skin is warm and dry. No rash noted. He is not diaphoretic.  Psychiatric: He has a normal mood and affect. His behavior is normal.       Assessment:     S/p removed of anal masses c/w hemorrhoids.  ?Small condylomata in anal canal - ablated.  Healing fine    Plan:     I noted the healing process can take some time for things to fully soften down and flattened.  But it is definitely improved from last visit.  His symptoms are improved.  He feels reassured.  Increase activity as tolerated.  Do not push through pain.  Advanced on diet as tolerated. Bowel regimen to avoid problems.  Return to clinic in 3-54months for anoscopy to see if any evidence of recurrence of condyloma.. The patient expressed  understanding and appreciation

## 2012-03-01 IMAGING — CR DG CERVICAL SPINE COMPLETE 4+V
5 series · 5 of 5 positions shown · non-contrast
Comparison: None.

CLINICAL DATA: Neck pain for 4 months, no acute injury

CERVICAL SPINE - COMPLETE 4+ VIEW

[view not recorded (1 of 5)]
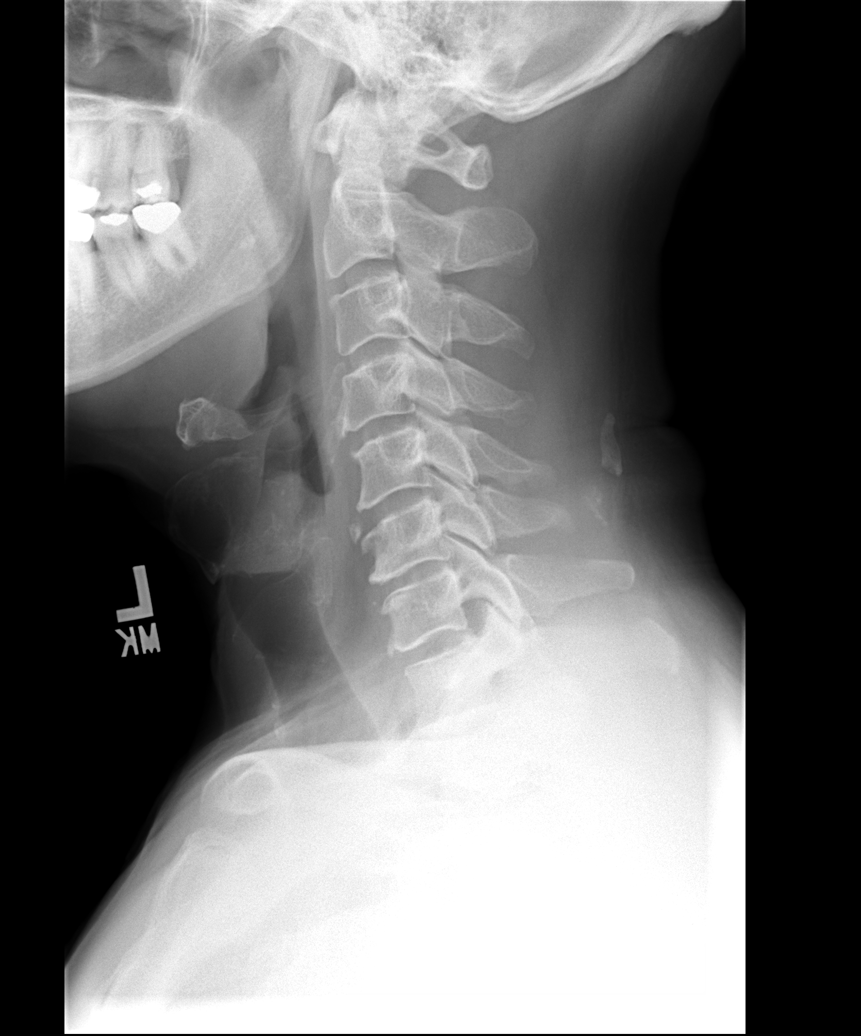

[view not recorded (2 of 5)]
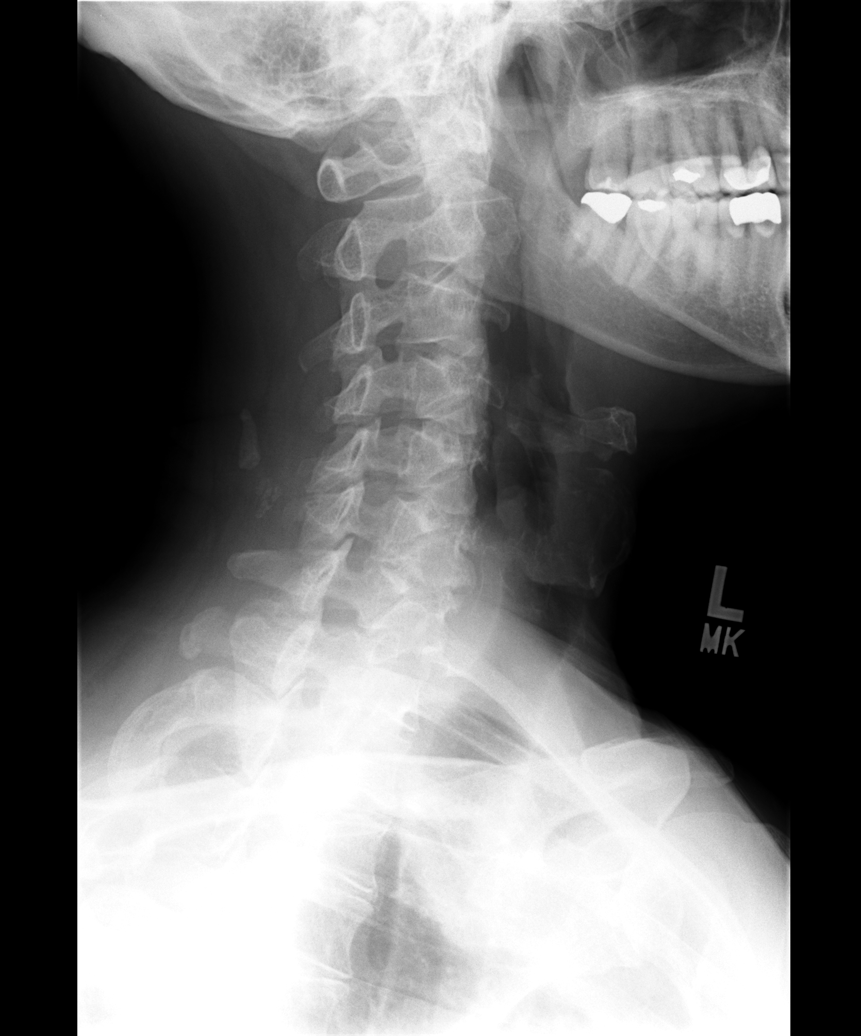

[view not recorded (3 of 5)]
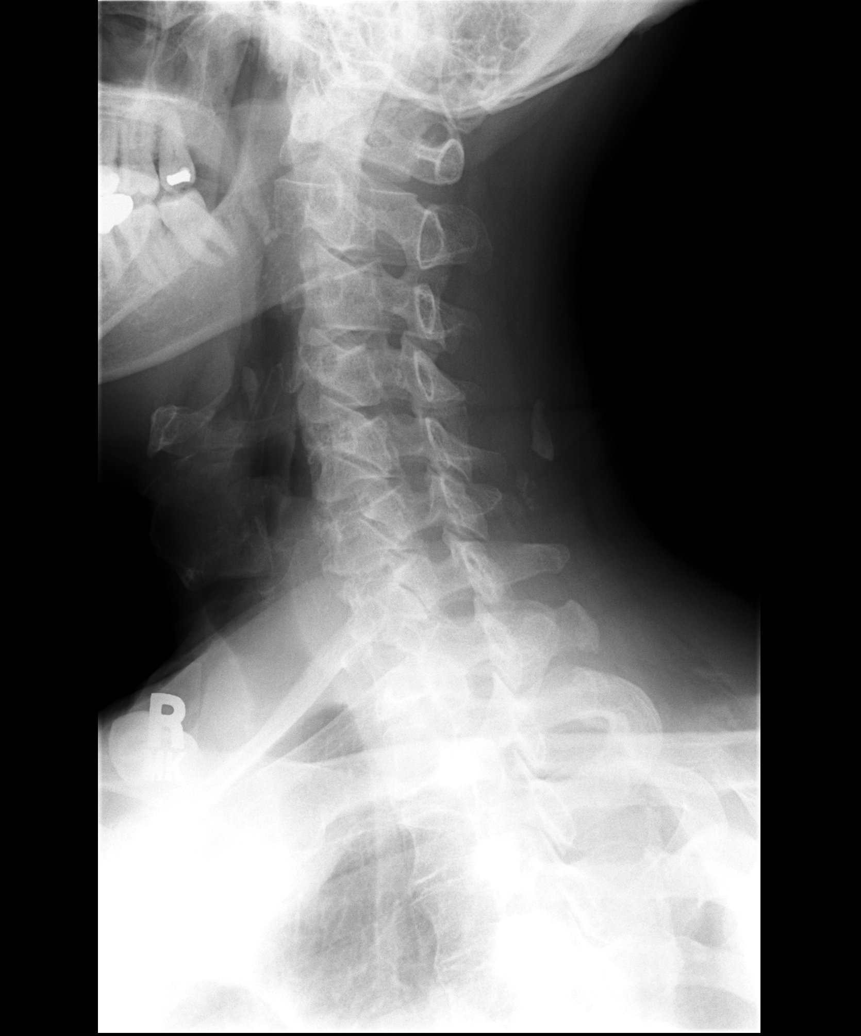

[view not recorded (4 of 5)]
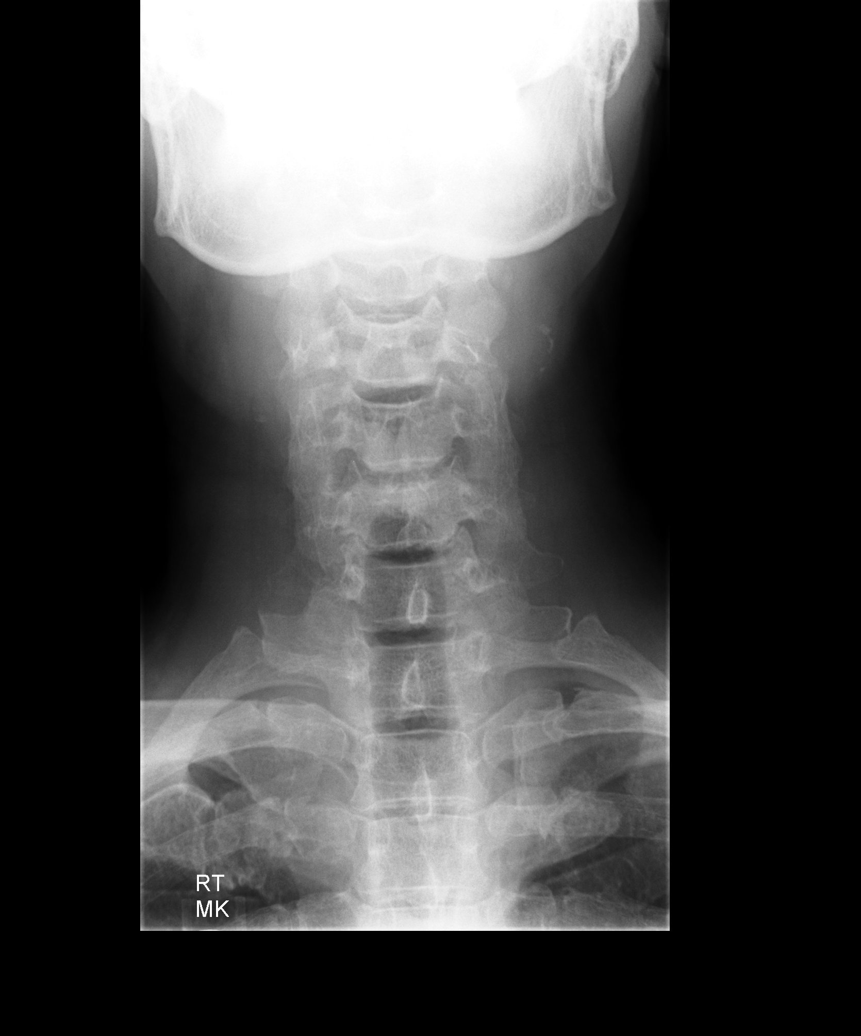

[view not recorded (5 of 5)]
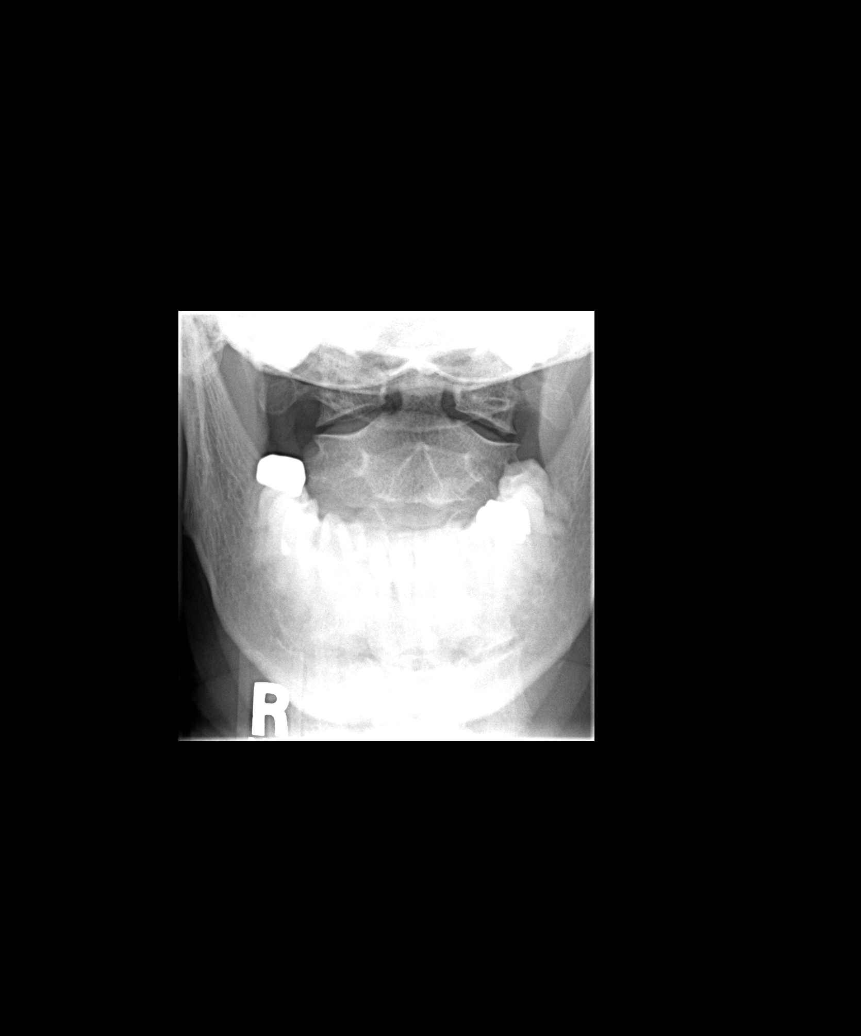

[5 of 5 positions shown; findings below may reference images not displayed]

FINDINGS: The cervical vertebrae are in normal alignment.  Minimal
anterior osteophyte formation is noted from C4-C7.  There is very
slightly decreased disc space at the C6-7 level.  No prevertebral
soft tissue swelling is seen.  On oblique views the foramina are
patent.  The odontoid process is intact.
IMPRESSION: Mild degenerative disc disease at C6-7 with normal alignment.  No
evidence of foraminal narrowing.

## 2012-06-20 ENCOUNTER — Encounter (INDEPENDENT_AMBULATORY_CARE_PROVIDER_SITE_OTHER): Payer: Self-pay | Admitting: Surgery

## 2012-06-20 ENCOUNTER — Ambulatory Visit (INDEPENDENT_AMBULATORY_CARE_PROVIDER_SITE_OTHER): Payer: Medicare Other | Admitting: Surgery

## 2012-06-20 VITALS — BP 134/62 | HR 60 | Temp 97.6°F | Resp 16 | Ht 69.0 in | Wt 173.0 lb

## 2012-06-20 DIAGNOSIS — K6289 Other specified diseases of anus and rectum: Secondary | ICD-10-CM | POA: Insufficient documentation

## 2012-06-20 DIAGNOSIS — K644 Residual hemorrhoidal skin tags: Secondary | ICD-10-CM

## 2012-06-20 DIAGNOSIS — R198 Other specified symptoms and signs involving the digestive system and abdomen: Secondary | ICD-10-CM

## 2012-06-20 DIAGNOSIS — A630 Anogenital (venereal) warts: Secondary | ICD-10-CM

## 2012-06-20 MED ORDER — HYDROCORTISONE ACE-PRAMOXINE 2.5-1 % RE CREA: TOPICAL_CREAM | Freq: Four times a day (QID) | RECTAL | Status: AC

## 2012-06-20 NOTE — Progress Notes (Signed)
Subjective:     Patient ID: Ryan Townsend, male   DOB: [birth year], 67 y.o.   MRN: [id number]  HPI   Ryan Townsend  01-20-1946 [id number]  Patient Care Team: Pecola Lawless, MD as PCP - General (Internal Medicine) Griffith Citron, MD as Consulting Physician (Gastroenterology)  This patient is a 67 y.o.male who presents today for surgical evaluation s/p removal of hemorrhoids & ablation of small warts 01/14/2012  Patient comes in doing okay.  His symptoms went away for while.  However he feels itching again.  A little blood when he wipes.  No fevers or chills.  Having 2 moderate bowel movements in the morning only.  He is concerned something may be coming back.  He wonders if he has pruritus ani.    FINAL DIAGNOSIS Diagnosis 1. Hemorrhoids, anal mass left posterior lateral - HYPERTROPHIC SQUAMOUS MUCOSA WITH UNDERLYING TISSUE, CONSISTENT WITH HEMORRHOID TISSUE. 2. Hemorrhoids, anal mass anterior midline - HYPERTROPHIC SQUAMOUS MUCOSA WITH UNDERLYING TISSUE, CONSISTENT WITH HEMORRHOID TISSUE. Abigail Miyamoto MD Pathologist, Electronic Signature (Case signed 01/15/2012)  Patient Active Problem List  Diagnosis  . HYPOTHYROIDISM  . HYPERLIPIDEMIA  . HYPERTENSION  . LOW BACK PAIN, CHRONIC  . COLONIC POLYPS, HX OF  . External hemorrhoids with pain & itching  . H/O urinary retention after hemorrhoidectomy 1982  . Anal canal condylomata    Past Medical History  Diagnosis Date  . Hyperlipidemia   . Hypertension   . Thyroid disease   . Varicose vein   . Anal warts 1979  . Hemorrhoids   . Hypothyroidism   . Nocturia     Past Surgical History  Procedure Laterality Date  . Hemorroidectomy  1982  . Removal of anal warts  1979  . Hemorroid banding  2012    History   Social History  . Marital Status: Married    Spouse Name: N/A    Number of Children: N/A  . Years of Education: N/A   Occupational History  . Education administrator   Social  History Main Topics  . Smoking status: Former Smoker -- 1.00 packs/day for 10 years    Types: Cigarettes    Quit date: 11/01/1976  . Smokeless tobacco: Never Used  . Alcohol Use: Yes     Comment: occ  . Drug Use: No  . Sexually Active:    Other Topics Concern  . Not on file   Social History Narrative  . No narrative on file    Family History  Problem Relation Age of Onset  . Cancer Mother     colon  . Hypertension Mother   . Cancer Sister     lung  . Kidney disease Sister     Current Outpatient Prescriptions  Medication Sig Dispense Refill  . aspirin 81 MG tablet Take 81 mg by mouth every evening.       Marland Kitchen atorvastatin (LIPITOR) 10 MG tablet Take 10 mg by mouth every evening.       Marland Kitchen DIOVAN 160 MG tablet Take 160 mg by mouth every morning.       Marland Kitchen SYNTHROID 75 MCG tablet Take 75 mcg by mouth every morning.        No current facility-administered medications for this visit.     No Known Allergies  BP 134/62  Pulse 60  Temp(Src) 97.6 F (36.4 C) (Temporal)  Resp 16  Ht 5\' 9"  (1.753 m)  Wt 173 lb (78.472 kg)  BMI 25.54  kg/m2  Dg Chest 2 View  12/31/2011  *RADIOLOGY REPORT*  Clinical Data: Preop exam.  CHEST - 2 VIEW  Comparison: None  Findings: The heart size and mediastinal contours are within normal limits.  Both lungs are clear.  The visualized skeletal structures are unremarkable.  IMPRESSION: Negative exam.   Original Report Authenticated By: Rosealee Albee, M.D.      Review of Systems  Constitutional: Negative for fever, chills and diaphoresis.  HENT: Negative for sore throat, trouble swallowing and neck pain.   Eyes: Negative for photophobia and visual disturbance.  Respiratory: Negative for choking and shortness of breath.   Cardiovascular: Negative for chest pain and palpitations.  Gastrointestinal: Negative for nausea, vomiting, abdominal distention, anal bleeding and rectal pain.  Genitourinary: Negative for dysuria, urgency, difficulty urinating  and testicular pain.  Musculoskeletal: Negative for myalgias, arthralgias and gait problem.  Skin: Negative for color change and rash.  Neurological: Negative for dizziness, speech difficulty, weakness and numbness.  Hematological: Negative for adenopathy.  Psychiatric/Behavioral: Negative for hallucinations, confusion and agitation.       Objective:   Physical Exam  Constitutional: He is oriented to person, place, and time. He appears well-developed and well-nourished. No distress.  HENT:  Head: Normocephalic.  Mouth/Throat: Oropharynx is clear and moist. No oropharyngeal exudate.  Eyes: Conjunctivae and EOM are normal. Pupils are equal, round, and reactive to light. No scleral icterus.  Neck: Normal range of motion. No tracheal deviation present.  Cardiovascular: Normal rate, normal heart sounds and intact distal pulses.   Pulmonary/Chest: Effort normal. No respiratory distress.  Abdominal: Soft. He exhibits no distension. There is no tenderness. Hernia confirmed negative in the right inguinal area and confirmed negative in the left inguinal area.  Incisions clean with normal healing ridges.  No hernias  Genitourinary:  Exam done with assistance of male Medical Assistant in the room.  Perianal skin clean with good hygiene.  No pruritis.  Is for hemorrhoidectomy sites on right anterior and left posterior regions still some nodularity.  Mild superficial ulceration  No other external skin tags / hemorrhoids of significance.  No pruritus a night.No pilonidal disease.  No fissure.  No abscess/fistula.  Normal sphincter tone.   Patient tolerates digital and anoscopic examination.  Anterior midline mild 1-40mm nodularity suspicious for recurrent condylomata within 2 cm of anal verge.  Mildly inflamed hemorrhoidal piles but nothing severe.  Rest of rectum normal.   Musculoskeletal: Normal range of motion. He exhibits no tenderness.  Neurological: He is alert and oriented to person, place, and  time. No cranial nerve deficit. He exhibits normal muscle tone. Coordination normal.  Skin: Skin is warm and dry. No rash noted. He is not diaphoretic.  Psychiatric: He has a normal mood and affect. His behavior is normal.       Assessment:     s/p removal of anal masses c/w hemorrhoids But with persistent nodularity and no ulceration.  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  I recommended examination under anesthesia with removal of masses.  This time keep the area open And avoid any stitches that may provide a foreign body reaction.  Remove anterior midline anal canal masses and sent off to make sure diagnosis is correct for condylomata.  He seemed very hesitant to do this since he just got over surgery five months ago.  I offered a second opinion with my new colorectal partner, Dr.  Romie Levee.  He is interested in pursuing that.  Analpram cream to help with the itching and irritation.  I noted to NOT use indefinitely.  He notes he has only used a few 2-3-week periods at a time.  Increase activity as tolerated.  Do not push through pain.  Bowel regimen to avoid problems.  He claims to continue to use a high-fiber diet

## 2012-06-20 NOTE — Patient Instructions (Signed)
Do trial of Analpram cream to help calm the itching around the anus.  Does not seem consistent with neuritis pain or other allergic reaction at this time.  Follow up with my partner, Dr. Maisie Fus, for second opinion on perianal masses.  Consider surgery to remove masses around the anus.  Genital Warts Genital warts are a sexually transmitted infection. They may appear as small bumps on the tissues of the genital area. CAUSES  Genital warts are caused by a virus called human papillomavirus (HPV). HPV is the most common sexually transmitted disease (STD) and infection of the sex organs. This infection is spread by having unprotected sex with an infected person. It can be spread by vaginal, anal, and oral sex. Many people do not know they are infected. They may be infected for years without problems. However, even if they do not have problems, they can unknowingly pass the infection to their sexual partners. SYMPTOMS   Itching and irritation in the genital area.  Warts that bleed.  Painful sexual intercourse. DIAGNOSIS  Warts are usually recognized with the naked eye on the vagina, vulva, perineum, anus, and rectum. Certain tests can also diagnose genital warts, such as:  A Pap test.  A tissue sample (biopsy) exam.  Colposcopy. A magnifying tool is used to examine the vagina and cervix. The HPV cells will change color when certain solutions are used. TREATMENT  Warts can be removed by:  Applying certain chemicals, such as cantharidin or podophyllin.  Liquid nitrogen freezing (cryotherapy).  Immunotherapy with candida or trichophyton injections.  Laser treatment.  Burning with an electrified probe (electrocautery).  Interferon injections.  Surgery. PREVENTION  HPV vaccination can help prevent HPV infections that cause genital warts and that cause cancer of the cervix. It is recommended that the vaccination be given to people between the ages 18 to 50 years old. The vaccine might  not work as well or might not work at all if you already have HPV. It should not be given to pregnant women. HOME CARE INSTRUCTIONS   It is important to follow your caregiver's instructions. The warts will not go away without treatment. Repeat treatments are often needed to get rid of warts. Even after it appears that the warts are gone, the normal tissue underneath often remains infected.  Do not try to treat genital warts with medicine used to treat hand warts. This type of medicine is strong and can burn the skin in the genital area, causing more damage.  Tell your past and current sexual partner(s) that you have genital warts. They may be infected also and need treatment.  Avoid sexual contact while being treated.  Do not touch or scratch the warts. The infection may spread to other parts of your body.  Women with genital warts should have a cervical cancer check (Pap test) at least once a year. This type of cancer is slow-growing and can be cured if found early. Chances of developing cervical cancer are increased with HPV.  Inform your obstetrician about your warts in the event of pregnancy. This virus can be passed to the baby's respiratory tract. Discuss this with your caregiver.  Use a condom during sexual intercourse. Following treatment, the use of condoms will help prevent reinfection.  Ask your caregiver about using over-the-counter anti-itch creams. SEEK MEDICAL CARE IF:   Your treated skin becomes red, swollen, or painful.  You have a fever.  You feel generally ill.  You feel little lumps in and around your genital area.  You are bleeding or have painful sexual intercourse. MAKE SURE YOU:   Understand these instructions.  Will watch your condition.  Will get help right away if you are not doing well or get worse. Document Released: 04/24/2000 Document Revised: 07/20/2011 Document Reviewed: 11/03/2010 [facility] Patient Information 2013 [place], [place].  GETTING TO  GOOD BOWEL HEALTH. Irregular bowel habits such as constipation and diarrhea can lead to many problems over time.  Having one soft bowel movement a day is the most important way to prevent further problems.  The anorectal canal is designed to handle stretching and feces to safely manage our ability to get rid of solid waste (feces, poop, stool) out of our body.  BUT, hard constipated stools can act like ripping concrete bricks and diarrhea can be a burning fire to this very sensitive area of our body, causing inflamed hemorrhoids, anal fissures, increasing risk is perirectal abscesses, abdominal pain/bloating, an making irritable bowel worse.     The goal: ONE SOFT BOWEL MOVEMENT A DAY!  To have soft, regular bowel movements:    Drink at least 8 tall glasses of water a day.     Take plenty of fiber.  Fiber is the undigested part of plant food that passes into the colon, acting s "natures broom" to encourage bowel motility and movement.  Fiber can absorb and hold large amounts of water. This results in a larger, bulkier stool, which is soft and easier to pass. Work gradually over several weeks up to 6 servings a day of fiber (25g a day even more if needed) in the form of: o Vegetables -- Root (potatoes, carrots, turnips), leafy green (lettuce, salad greens, celery, spinach), or cooked high residue (cabbage, broccoli, etc) o Fruit -- Fresh (unpeeled skin & pulp), Dried (prunes, apricots, cherries, etc ),  or stewed ( applesauce)  o Whole grain breads, pasta, etc (whole wheat)  o Bran cereals    Bulking Agents -- This type of water-retaining fiber generally is easily obtained each day by one of the following:  o Psyllium bran -- The psyllium plant is remarkable because its ground seeds can retain so much water. This product is available as Metamucil, Konsyl, Effersyllium, Per Diem Fiber, or the less expensive generic preparation in drug and health food stores. Although labeled a laxative, it really is not a  laxative.  o Methylcellulose -- This is another fiber derived from wood which also retains water. It is available as Citrucel. o Polyethylene Glycol - and "artificial" fiber commonly called Miralax or Glycolax.  It is helpful for people with gassy or bloated feelings with regular fiber o Flax Seed - a less gassy fiber than psyllium   No reading or other relaxing activity while on the toilet. If bowel movements take longer than 5 minutes, you are too constipated   AVOID CONSTIPATION.  High fiber and water intake usually takes care of this.  Sometimes a laxative is needed to stimulate more frequent bowel movements, but    Laxatives are not a good long-term solution as it can wear the colon out. o Osmotics (Milk of Magnesia, Fleets phosphosoda, Magnesium citrate, MiraLax, GoLytely) are safer than  o Stimulants (Senokot, Castor Oil, Dulcolax, Ex Lax)    o Do not take laxatives for more than 7days in a row.    IF SEVERELY CONSTIPATED, try a Bowel Retraining Program: o Do not use laxatives.  o Eat a diet high in roughage, such as bran cereals and leafy vegetables.  o Drink six (  6) ounces of prune or apricot juice each morning.  o Eat two (2) large servings of stewed fruit each day.  o Take one (1) heaping tablespoon of a psyllium-based bulking agent twice a day. Use sugar-free sweetener when possible to avoid excessive calories.  o Eat a normal breakfast.  o Set aside 15 minutes after breakfast to sit on the toilet, but do not strain to have a bowel movement.  o If you do not have a bowel movement by the third day, use an enema and repeat the above steps.    Controlling diarrhea o Switch to liquids and simpler foods for a few days to avoid stressing your intestines further. o Avoid dairy products (especially milk & ice cream) for a short time.  The intestines often can lose the ability to digest lactose when stressed. o Avoid foods that cause gassiness or bloating.  Typical foods include beans and  other legumes, cabbage, broccoli, and dairy foods.  Every person has some sensitivity to other foods, so listen to our body and avoid those foods that trigger problems for you. o Adding fiber (Citrucel, Metamucil, psyllium, Miralax) gradually can help thicken stools by absorbing excess fluid and retrain the intestines to act more normally.  Slowly increase the dose over a few weeks.  Too much fiber too soon can backfire and cause cramping & bloating. o Probiotics (such as active yogurt, Align, etc) may help repopulate the intestines and colon with normal bacteria and calm down a sensitive digestive tract.  Most studies show it to be of mild help, though, and such products can be costly. o Medicines:   Bismuth subsalicylate (ex. Kayopectate, Pepto Bismol) every 30 minutes for up to 6 doses can help control diarrhea.  Avoid if pregnant.   Loperamide (Immodium) can slow down diarrhea.  Start with two tablets (4mg  total) first and then try one tablet every 6 hours.  Avoid if you are having fevers or severe pain.  If you are not better or start feeling worse, stop all medicines and call your doctor for advice o Call your doctor if you are getting worse or not better.  Sometimes further testing (cultures, endoscopy, X-ray studies, bloodwork, etc) may be needed to help diagnose and treat the cause of the diarrhea. o

## 2012-06-27 ENCOUNTER — Telehealth (INDEPENDENT_AMBULATORY_CARE_PROVIDER_SITE_OTHER): Payer: Self-pay

## 2012-06-27 NOTE — Telephone Encounter (Signed)
Returned call and [facility] for pt to call me back so I can advise him to keep the appt with Dr Maisie Fus in our group. I do not know anything about Dr [facility] office calling him b/c I have not spoke to them about the pt.

## 2012-06-27 NOTE — Telephone Encounter (Signed)
Pt returned my call. I advised him of still keeping the appt with Dr Maisie Fus and he agrees.

## 2012-07-06 ENCOUNTER — Encounter (INDEPENDENT_AMBULATORY_CARE_PROVIDER_SITE_OTHER): Payer: Self-pay | Admitting: General Surgery

## 2012-07-06 ENCOUNTER — Ambulatory Visit (INDEPENDENT_AMBULATORY_CARE_PROVIDER_SITE_OTHER): Payer: Medicare Other | Admitting: General Surgery

## 2012-07-06 VITALS — BP 122/66 | HR 60 | Temp 98.1°F | Resp 16 | Ht 69.0 in | Wt 168.0 lb

## 2012-07-06 DIAGNOSIS — L290 Pruritus ani: Secondary | ICD-10-CM

## 2012-07-06 NOTE — Patient Instructions (Signed)
Ok to use steroid cream for itching occasionally

## 2012-07-06 NOTE — Progress Notes (Signed)
Chief Complaint  Patient presents with  . Re-evaluation    2nd opinion for anal itching - per SG    HISTORY:  Ryan Townsend is a 67 y.o. male who presents to clinic with anal itching.  This has occurred throughout his life, but got a bit worse after hemorrhoid surgery.  Now the itching is on occasion.  He denies any anal leakage or prolapsed tissue.  He has tried dietary modifications in the past.  Steroid cream relieves the itching.    Past Medical History  Diagnosis Date  . Hyperlipidemia   . Hypertension   . Thyroid disease   . Varicose vein   . Anal warts 1979  . Hemorrhoids   . Hypothyroidism   . Nocturia   . External hemorrhoids with pain & itching 11/02/2011       Past Surgical History  Procedure Laterality Date  . Hemorroidectomy  1982  . Removal of anal warts  1979  . Hemorroid banding  2012  . Anus surgery  Sep2013    Removal of the external anal masses x2  . Condyloma excision/fulguration  [id number]      Current Outpatient Prescriptions  Medication Sig Dispense Refill  . aspirin 81 MG tablet Take 81 mg by mouth every evening.       Marland Kitchen atorvastatin (LIPITOR) 10 MG tablet Take 10 mg by mouth every evening.       Marland Kitchen DIOVAN 160 MG tablet Take 160 mg by mouth every morning.       . hydrocortisone-pramoxine (ANALPRAM-HC) 2.5-1 % rectal cream Place rectally 4 (four) times daily. For irritated and painful hemorrhoids  15 g  2  . SYNTHROID 75 MCG tablet Take 75 mcg by mouth every morning.        No current facility-administered medications for this visit.     No Known Allergies    Family History  Problem Relation Age of Onset  . Cancer Mother     colon  . Hypertension Mother   . Cancer Sister     lung  . Kidney disease Sister       History   Social History  . Marital Status: Married    Spouse Name: N/A    Number of Children: N/A  . Years of Education: N/A   Occupational History  . Education administrator   Social History Main  Topics  . Smoking status: Former Smoker -- 1.00 packs/day for 10 years    Types: Cigarettes    Quit date: 11/01/1976  . Smokeless tobacco: Never Used  . Alcohol Use: Yes     Comment: occ  . Drug Use: No  . Sexually Active:    Other Topics Concern  . None   Social History Narrative  . None       REVIEW OF SYSTEMS - PERTINENT POSITIVES ONLY: Review of Systems - General ROS: negative for - chills, fatigue or fever Respiratory ROS: no cough, shortness of breath, or wheezing Cardiovascular ROS: no chest pain or dyspnea on exertion Gastrointestinal ROS: no abdominal pain, change in bowel habits, or black or bloody stools  EXAM: Filed Vitals:   07/06/12 1427  BP: 122/66  Pulse: 60  Temp: 98.1 F (36.7 C)  Resp: 16    General appearance: alert and cooperative GI: soft, non-tender; bowel sounds normal; no masses,  no organomegaly . Procedure: Anoscopy Surgeon: Maisie Fus Assistant: Christella Scheuermann After the risks and benefits were explained, verbal consent was obtained for above procedure  Anesthesia: none Diagnosis: anal itching Findings: no pathology noted, no skin changes, no prolapsing tissue   ASSESSMENT AND PLAN: Ryan Townsend is a patient of Dr Michaell Cowing, who has a long history of anal itching.  I do not see any signs of pruritis ani.  The steroid cream helps and he doesn't have to use it often.  I have recommended that he continue using this cream unless it becomes more of a problem in the future.  We discussed the risks of long term usage of steroid cream on the anus.  He assures me that he does not use it that often.      Vanita Panda, MD Colon and Rectal Surgery / General Surgery [facility]      Visit Diagnoses: 1. Anal itching     Primary Care Physician: Marga Melnick, MD

## 2012-08-10 ENCOUNTER — Ambulatory Visit: Payer: BC Managed Care – PPO | Admitting: Internal Medicine

## 2012-08-12 ENCOUNTER — Ambulatory Visit (INDEPENDENT_AMBULATORY_CARE_PROVIDER_SITE_OTHER): Payer: Medicare Other | Admitting: Internal Medicine

## 2012-08-12 ENCOUNTER — Encounter: Payer: Self-pay | Admitting: Internal Medicine

## 2012-08-12 VITALS — BP 118/66 | HR 55 | Wt 168.0 lb

## 2012-08-12 DIAGNOSIS — H1013 Acute atopic conjunctivitis, bilateral: Secondary | ICD-10-CM

## 2012-08-12 DIAGNOSIS — H1010 Acute atopic conjunctivitis, unspecified eye: Secondary | ICD-10-CM | POA: Insufficient documentation

## 2012-08-12 MED ORDER — FLUTICASONE PROPIONATE 50 MCG/ACT NA SUSP: 1.0000 | Freq: Two times a day (BID) | NASAL | Status: AC | PRN

## 2012-08-12 NOTE — Progress Notes (Signed)
  Subjective:    Patient ID: Ryan Townsend, male    DOB: 04-07-1946, 67 y.o.   MRN: [id number]  HPI He has retired from [employer] ; he was having an annual physical at that facility. The last complete exam was in July 2013.  His past history was reviewed in the chart updated    Review of Systems  He describes as seasonal allergies manifested as itchy, watery eyes and sneezing each morning since 2008 he moved to this area. He had been taking cortisone shots each year.  He had not found the over-the-counter loratadine or Nasonex intranasally of benefit to date.  He has not had shortness of breath or wheezing with the rhinoconjunctivitis picture.     Objective:   Physical Exam General appearance:good health ;well nourished; no acute distress or increased work of breathing is present.  No  lymphadenopathy about the head, neck, or axilla noted.   Eyes: No conjunctival inflammation or lid edema is present.   Ears:  External ear exam shows no significant lesions or deformities.  Otoscopic examination reveals wax on L;  R TM nomal. Nose:  External nasal examination shows no deformity or inflammation. Nasal mucosa are pink and moist without lesions or exudates. No septal dislocation or deviation.No obstruction to airflow.   Oral exam: Dental hygiene is good; lips and gums are healthy appearing.There is no oropharyngeal erythema or exudate noted.   Neck:  No deformities, masses, or tenderness noted.      Heart:  Normal rate and regular rhythm. S1 and S2 normal without gallop,  click, rub. Grade 1/2 over 6 systolic murmur   Lungs:Chest clear to auscultation; no wheezes, rhonchi,rales ,or rubs present.No increased work of breathing.    Extremities:  No cyanosis, edema, or clubbing  noted    Skin: Warm & dry        Assessment & Plan:  #1 seasonal rhinoconjunctivitis Plan : see orders

## 2012-08-12 NOTE — Patient Instructions (Addendum)
Plain Mucinex (NOT D) for thick secretions ;force NON dairy fluids .   Nasal cleansing in the shower as discussed with lather of mild shampoo.After 10 seconds wash off lather while  exhaling through nostrils. Make sure that all residual soap is removed to prevent irritation.  Fluticasone 1 spray in each nostril twice a day as needed. Use the "crossover" technique into opposite nostril spraying toward opposite ear @ 45 degree angle, not straight up into nostril.  Use a Neti pot daily only  as needed for significant sinus congestion; going from open side to congested side . Plain Allegra (NOT D )  160 daily  OR Zyrtec 10 mg @ bedtime  as needed for itchy eyes & sneezing.

## 2012-10-06 ENCOUNTER — Encounter: Payer: Medicare Other | Admitting: Internal Medicine

## 2012-11-22 ENCOUNTER — Encounter: Payer: Medicare Other | Admitting: Internal Medicine

## 2013-01-17 ENCOUNTER — Other Ambulatory Visit: Payer: Self-pay | Admitting: Internal Medicine

## 2013-01-17 ENCOUNTER — Encounter: Payer: Self-pay | Admitting: Internal Medicine

## 2013-01-17 ENCOUNTER — Ambulatory Visit (INDEPENDENT_AMBULATORY_CARE_PROVIDER_SITE_OTHER): Payer: Medicare Other | Admitting: Internal Medicine

## 2013-01-17 VITALS — BP 131/71 | HR 56 | Temp 98.5°F | Ht 70.5 in | Wt 180.8 lb

## 2013-01-17 DIAGNOSIS — Z860100 Personal history of colon polyps, unspecified: Secondary | ICD-10-CM

## 2013-01-17 DIAGNOSIS — Z23 Encounter for immunization: Secondary | ICD-10-CM

## 2013-01-17 DIAGNOSIS — R9431 Abnormal electrocardiogram [ECG] [EKG]: Secondary | ICD-10-CM | POA: Insufficient documentation

## 2013-01-17 DIAGNOSIS — I10 Essential (primary) hypertension: Secondary | ICD-10-CM

## 2013-01-17 DIAGNOSIS — E785 Hyperlipidemia, unspecified: Secondary | ICD-10-CM

## 2013-01-17 DIAGNOSIS — E039 Hypothyroidism, unspecified: Secondary | ICD-10-CM

## 2013-01-17 DIAGNOSIS — Z0000 Encounter for general adult medical examination without abnormal findings: Secondary | ICD-10-CM

## 2013-01-17 LAB — CBC WITH DIFFERENTIAL/PLATELET
Basophils Relative: 0.3 % (ref 0.0–3.0)
Eosinophils Absolute: 0.3 10*3/uL (ref 0.0–0.7)
HCT: 42.4 % (ref 39.0–52.0)
Lymphs Abs: 1.4 10*3/uL (ref 0.7–4.0)
MCHC: 33.3 g/dL (ref 30.0–36.0)
MCV: 88.2 fl (ref 78.0–100.0)
Monocytes Absolute: 0.5 10*3/uL (ref 0.1–1.0)
Neutrophils Relative %: 63.6 % (ref 43.0–77.0)
RBC: 4.81 Mil/uL (ref 4.22–5.81)

## 2013-01-17 LAB — BASIC METABOLIC PANEL
BUN: 13 mg/dL (ref 6–23)
CO2: 29 mEq/L (ref 19–32)
Chloride: 102 mEq/L (ref 96–112)
Creatinine, Ser: 0.9 mg/dL (ref 0.4–1.5)

## 2013-01-17 LAB — HEPATIC FUNCTION PANEL
Albumin: 4 g/dL (ref 3.5–5.2)
Bilirubin, Direct: 0.2 mg/dL (ref 0.0–0.3)
Total Protein: 6.9 g/dL (ref 6.0–8.3)

## 2013-01-17 LAB — TSH: TSH: 1.83 u[IU]/mL (ref 0.35–5.50)

## 2013-01-17 LAB — PSA: PSA: 1.31 ng/mL (ref 0.10–4.00)

## 2013-01-17 NOTE — Patient Instructions (Addendum)
If you activate the  My Chart system; lab & Xray results will be released directly  to you as soon as I review & address these through the computer. If you choose not to sign up for My Chart within 36 hours of labs being drawn; results will be reviewed & interpretation added before being copied & mailed, causing a delay in getting the results to you.If you do not receive that report within 7-10 days ,please call. Additionally you can use this system to gain direct  access to your records  if  out of town or @ an office of a  physician who is not in  the My Chart network.  This improves continuity of care & places you in control of your medical record.  Go to Web MD for eustachian tube dysfunction. Drink thin  fluids liberally through the day and chew sugarless gum . Do the Valsalva maneuver several times a day to "pop" ears open. Flonase 1 spray in each nostril twice a day as needed. Use the "crossover" technique as discussed. Use a Neti pot daily- 2x /day  as needed for sinus congestion   Please do not use Q-tips as we discussed. Should wax build up occur, please put 2-3 drops of mineral oil in the affected  ear at night to soften the wax .Cover the canal with a  cotton ball to prevent the oil from staining bed linens. In the morning fill the ear canal with hydrogen peroxide & lie in the opposite lateral decubitus position(on the side opposite the affected ear)  for 10-15 minutes. After allowing this period of time for the peroxide to dissolve the wax ;shower and use the thinnest washrag available to wick out the wax. If both ears are involved ; alternate this treatment from ear to ear each night until no wax is found on the washrag.

## 2013-01-17 NOTE — Progress Notes (Signed)
Subjective:    Patient ID: Ryan Townsend, male    DOB: 1945/10/21, 67 y.o.   MRN: [id number]  HPI Medicare Wellness Visit:  Psychosocial & medical history were reviewed as required by Medicare (abuse,antisocial behavioral risks,firearm risk).  Social history: caffeine: minimal , alcohol: rarely  ,  tobacco use:  Quit 1978 Exercise :  See below Home & personal  safety / fall risk: no Limitations of activities of daily living:no Seatbelt  and smoke alarm use:yes Power of Attorney/Living Will status : in place Ophthalmology exam status :current Hearing evaluation status:not current Orientation :oriented X 3 Memory & recall :good Math testing: good Active depression / anxiety:denied Foreign travel history : [place] 2014 Immunization status for Shingles /Flu/ PNA/ tetanus : Flu needed Transfusion history:  no Preventive health surveillance status of colonoscopy as per protocol/ SOC: current Dental care:  Every 6 mos Chart reviewed &  Updated. Active issues reviewed & addressed.      Review of Systems He is on a heart healthy diet; he exercises 30 minutes > 3 times per week without symptoms. Specifically he denies chest pain, palpitations, dyspnea, or claudication.  Family history is negative for premature coronary disease.  Advanced cholesterol testing not done to date.  He describes intermittent pressure sensation in his ears.     Objective:   Physical Exam Gen.: Healthy and well-nourished in appearance. Alert, appropriate and cooperative throughout exam.  Head: Normocephalic without obvious abnormalities;  pattern alopecia  Eyes: No corneal or conjunctival inflammation noted. Pupils equal round reactive to light and accommodation.  Extraocular motion intact. Vision grossly normal with lenses Ears: External  ear exam reveals no significant lesions or deformities. Right canal is patent and tympanic membrane normal. There is some increased wax in the left canal with some obscuration  of the left TM. Hearing is grossly normal bilaterally. Nose: External nasal exam reveals no deformity or inflammation. Nasal mucosa are pink and moist. No lesions or exudates noted.  Mouth: Oral mucosa and oropharynx reveal no lesions or exudates. Teeth in good repair. Neck: No deformities, masses, or tenderness noted. Range of motion & Thyroid normal. Lungs: Normal respiratory effort; chest expands symmetrically. Lungs are clear to auscultation without rales, wheezes, or increased work of breathing. Heart: Normal rate and rhythm. Normal S1 and S2. No gallop, click, or rub. S4 without murmur. Abdomen: Bowel sounds normal; abdomen soft and nontender. No masses, organomegaly or hernias noted. Genitalia:  Small granuloma left epididymal area. Excellent model tags. Prostate is normal without enlargement, asymmetry, nodularity, or induration                               Musculoskeletal/extremities: No deformity or scoliosis noted of  the thoracic or lumbar spine.  No clubbing, cyanosis, edema, or significant extremity  deformity noted. Range of motion normal .Tone & strength  Normal. Joints normal . Nail health good. Able to lie down & sit up w/o help. Negative SLR bilaterally Vascular: Carotid, radial artery, dorsalis pedis and  posterior tibial pulses are full and equal. No bruits present. Neurologic: Alert and oriented x3. Deep tendon reflexes symmetrical and normal.          Skin: Intact without suspicious lesions or rashes. Lymph: No cervical, axillary, or inguinal lymphadenopathy present. Psych: Mood and affect are normal. Normally interactive  Assessment & Plan:  #1 Medicare Wellness Exam; criteria met ; data entered #2 Problem List/Diagnoses reviewed #3 Eustachian tube dysfunction & cerumen on L #4 PSA requested by patient Plan:  Assessments made/ Orders entered

## 2013-01-18 ENCOUNTER — Encounter: Payer: Self-pay | Admitting: Internal Medicine

## 2013-01-18 LAB — NMR LIPOPROFILE WITH LIPIDS
Cholesterol, Total: 156 mg/dL (ref <200)
HDL Particle Number: 34.6 umol/L (ref >=30.5)
Large HDL-P: 4.8 umol/L (ref >=4.8)
Large VLDL-P: 0.9 nmol/L (ref <=2.7)
Small LDL Particle Number: 268 nmol/L (ref <=527)

## 2013-01-18 LAB — POCT URINALYSIS DIPSTICK
Bilirubin, UA: NEGATIVE
Blood, UA: NEGATIVE
Leukocytes, UA: NEGATIVE
Nitrite, UA: NEGATIVE
Protein, UA: NEGATIVE
Urobilinogen, UA: 0.2
pH, UA: 8.5

## 2013-01-19 ENCOUNTER — Encounter: Payer: Self-pay | Admitting: Internal Medicine

## 2013-02-09 ENCOUNTER — Encounter: Payer: Self-pay | Admitting: Internal Medicine

## 2013-02-09 DIAGNOSIS — E559 Vitamin D deficiency, unspecified: Secondary | ICD-10-CM | POA: Insufficient documentation

## 2013-02-09 DIAGNOSIS — D126 Benign neoplasm of colon, unspecified: Secondary | ICD-10-CM | POA: Insufficient documentation

## 2013-03-09 ENCOUNTER — Ambulatory Visit (INDEPENDENT_AMBULATORY_CARE_PROVIDER_SITE_OTHER): Payer: Medicare Other

## 2013-03-09 DIAGNOSIS — Z23 Encounter for immunization: Secondary | ICD-10-CM

## 2013-04-11 ENCOUNTER — Encounter: Payer: Self-pay | Admitting: Internal Medicine

## 2013-04-11 ENCOUNTER — Other Ambulatory Visit: Payer: Self-pay | Admitting: *Deleted

## 2013-04-11 MED ORDER — LEVOTHYROXINE SODIUM 75 MCG PO TABS: 75.0000 ug | ORAL_TABLET | Freq: Every morning | ORAL | Status: AC

## 2013-04-11 MED ORDER — VALSARTAN 160 MG PO TABS: 160.0000 mg | ORAL_TABLET | Freq: Every morning | ORAL | Status: DC

## 2013-04-11 MED ORDER — ATORVASTATIN CALCIUM 10 MG PO TABS: 10.0000 mg | ORAL_TABLET | Freq: Every evening | ORAL | Status: AC

## 2013-04-11 MED ORDER — VALSARTAN 160 MG PO TABS: 160.0000 mg | ORAL_TABLET | Freq: Every morning | ORAL | Status: AC

## 2013-05-12 ENCOUNTER — Encounter: Payer: Self-pay | Admitting: Internal Medicine

## 2013-05-15 ENCOUNTER — Other Ambulatory Visit: Payer: Self-pay | Admitting: *Deleted

## 2013-05-15 MED ORDER — LEVOTHYROXINE SODIUM 75 MCG PO TABS: 75.0000 ug | ORAL_TABLET | Freq: Every day | ORAL | Status: AC

## 2013-06-08 ENCOUNTER — Telehealth (INDEPENDENT_AMBULATORY_CARE_PROVIDER_SITE_OTHER): Payer: Self-pay | Admitting: *Deleted

## 2013-06-08 NOTE — Telephone Encounter (Signed)
Patient called asking about a refill on his Analpram.  Patient states that his insurance company is faxing over a form that has to be filled out by Dr. Johney Maine to get this prescription paid for and approved if Dr. Johney Maine is willing to do this.  I explained that once the fax is received it will be given to Dr. Johney Maine to review and then he can make that decision.  Patient states understanding at this time.

## 2013-06-14 ENCOUNTER — Telehealth (INDEPENDENT_AMBULATORY_CARE_PROVIDER_SITE_OTHER): Payer: Self-pay | Admitting: General Surgery

## 2013-06-14 NOTE — Telephone Encounter (Signed)
Pt's insurance form was incompletely filled out and returned, so they are sending it back to [place].  In particular, they need all areas filled in but most especially # 5.  Also please include Dr. Clyda Greener NPI number.  The pt was very pleasant but would like [facility] to assist in this promptly to have his insurance cover this costly medication.

## 2013-06-20 NOTE — Telephone Encounter (Signed)
LMOM for pt to call me back. Dr Johney Maine is not going to fill out the insurance form for prior authorization when he could switch the Analpram HC to Annusol HC 30gm apply QID to rectum per Dr Johney Maine. I need to know a current pharmacy so I can eprescribe this medication instead of Analpram HC.

## 2013-06-21 ENCOUNTER — Telehealth (INDEPENDENT_AMBULATORY_CARE_PROVIDER_SITE_OTHER): Payer: Self-pay

## 2013-06-21 DIAGNOSIS — L290 Pruritus ani: Secondary | ICD-10-CM

## 2013-06-21 MED ORDER — HYDROCORTISONE 2.5 % RE CREA
1.0000 | TOPICAL_CREAM | Freq: Four times a day (QID) | RECTAL | Status: AC
Start: 2013-06-21 — End: ?

## 2013-06-21 NOTE — Telephone Encounter (Signed)
Pt called me back from leaving a message yesterday. I amended Ryan Townsend's note yesterday and I started a new note today. I advised pt that Dr Johney Maine said we could try another rectal cream rx that hopefully his insurance would cover instead of the Analpram rx. I will e prescribe the Annusol HC 30gm apply to rectum QID per DR Gross. The pt requested we use [facility]

## 2013-06-21 NOTE — Telephone Encounter (Signed)
Call transferred to [facility]

## 2013-12-20 ENCOUNTER — Other Ambulatory Visit: Payer: Self-pay

## 2013-12-20 MED ORDER — VALSARTAN 160 MG PO TABS: 160.0000 mg | ORAL_TABLET | Freq: Every morning | ORAL | Status: AC

## 2019-08-18 ENCOUNTER — Ambulatory Visit: Admit: 2019-09-06 | Payer: MEDICARE | Attending: Physician | Primary: Physician

## 2019-08-18 NOTE — Telephone Encounter (Signed)
Hello Dr. Randa Evens,    PT would like to see you for a new issue - lower back, which I told him you do not see for. PT wanted me to at least inquire if you would be interested in seeing him for this.    PT last saw you on [employer] in 2018 for L shoulder.    Please let me know if you are interested in seeing PT for his back, or if I should schedule in SPINE.    Thank you,  Apolinar Junes

## 2019-09-05 ENCOUNTER — Ambulatory Visit: Admit: 2019-09-05 | Payer: MEDICARE | Attending: Physician | Primary: Physician

## 2019-09-05 DIAGNOSIS — M47816 Spondylosis without myelopathy or radiculopathy, lumbar region: Secondary | ICD-10-CM

## 2019-09-05 DIAGNOSIS — M48061 Spinal stenosis, lumbar region without neurogenic claudication: Secondary | ICD-10-CM

## 2019-09-05 DIAGNOSIS — G8929 Other chronic pain: Secondary | ICD-10-CM

## 2019-09-05 DIAGNOSIS — M545 Low back pain, unspecified: Secondary | ICD-10-CM

## 2019-09-05 MED ORDER — ASPIRIN 81 MG TABLET,DELAYED RELEASE: 81 mg | ORAL | Status: AC

## 2019-09-05 MED ORDER — LEVOTHYROXINE 100 MCG TABLET: 100 mcg | ORAL | Status: AC

## 2019-09-05 MED ORDER — CHOLECALCIFEROL (VITAMIN D3) 25 MCG (1,000 UNIT) TABLET: 1000 UNITS | ORAL | Status: AC

## 2019-09-05 MED ORDER — VALSARTAN 320 MG TABLET: 320 mg | ORAL | Status: AC

## 2019-09-05 MED ORDER — ATORVASTATIN 10 MG TABLET: 10 mg | ORAL | Status: AC

## 2019-09-05 MED ORDER — PSYLLIUM SEED (SUGAR) ORAL POWDER: ORAL | Status: AC

## 2019-09-05 MED ORDER — CYANOCOBALAMIN (VIT B-12) 1,000 MCG TABLET: 1,000 mcg | ORAL | Status: AC

## 2019-09-05 NOTE — Progress Notes (Signed)
Department of Orthopaedic Surgery  Non-Operative Spine Service    Patient ID:  Javyon Fontan  [id number]  74 y.o.  1946-04-06    DATE OF SERVICE: 09/05/2019    Visit Type: New patient    Requesting Provider: Provider, None Per Pati*     CHIEF COMPLAINT:    Travers Goodley is a 74 y.o. male who presents with a chief complaint of low back pain.    HISTORY OF PRESENT ILLNESS:    Patient was referred from Provider, None Per Pati* for consultation regarding low back pain, and we reviewed in detail the prior notes available. Noeh Sparacino reports that the symptoms began many years ago.     As of current, he rates the pain as a 2/10.  The pain is described as achy, deep. The patient does not have numbness and/or tingling in either arm. He has no issues with grip or hand coordination.    The patient reports low back pain that started many years ago and would flare up every 2-3 years. However, he is able to walk 3.5 miles daily with some fatigue at the end of his walk. A stress test was ordered to examine the fatigue, but it was normal. He has been doing stretching at home, has his own exercise regimen that works well for him. About 7 months ago, he was walking over to wash his face when he started having muscle spasms and felt frozen. During the first few days of this, the pain was significant and he was not even able to sit. He continued to do his daily stretches, the spasms improved, but mild pain continued. He was prescribed muscle relaxants by Dr. Stephens November, but this did not help him. His PCP ordered an x-ray for further workup.       Treatments     Maddon Horton has tried the following treatments:  Pharmacologic:    Tried Helped Side effects Comments   Oral NSAID No N/A N/A     Muscle relaxants Yes No N/A     Tramadol No N/A N/A     Narcotics No N/A N/A     Gabapentin No N/A N/A     Pregablin No N/A N/A     Oral steroids No N/A N/A     Anti-depressants No N/A N/A     Lidoderm No N/A N/A     Topical NSAID No N/A N/A     Topical  pain cream No N/A N/A        Physical:    Tried Helped Comments   Therapeutic exercise Yes Yes     Modalities No N/A     Traction No N/A     Manipulations No N/A     Acupuncture No N/A        Interventions:    Tried Helped Comments   Epidural Injections No N/A     Facet Injections No N/A     Nerve ablations No N/A     Surgery No N/A       Symptom Review     Garek Sofranko denies a history of fever, chills, night sweats, unintentional weight loss. He has no personal history of cancer. He reports no bowel/bladder changes, gait disturbances or recent falls.     PAST MEDICAL HISTORY    History reviewed. No pertinent past medical history.     PAST SURGICAL HISTORY    History reviewed. No pertinent surgical history.    ALLERGIES    Patient has no  allergy information on record.    MEDICATIONS    Current Outpatient Medications   Medication Sig Dispense Refill    aspirin 81 mg EC tablet Take 81 mg by mouth daily      atorvastatin (LIPITOR) 10 mg tablet TAKE 1 TABLET BY MOUTH EVERY DAY      cholecalciferol, vitamin D3, 1000 UNITS tablet Take by mouth      cyanocobalamin, Vitamin B12, 1,000 mcg tablet Take 1,000 mcg by mouth      levothyroxine 100 mcg tablet Take 100 mcg by mouth      psyllium (KONSYL) powder Take by mouth      valsartan (DIOVAN) 320 mg tablet        No current facility-administered medications for this visit.        SOCIAL HISTORY    Occupation:     Marital Status: Married   Tobacco Use:  reports that he has never smoked. He does not have any smokeless tobacco history on file.       FAMILY HISTORY    History reviewed. No pertinent family history.      Objective      Vitals    09/05/19 1118   PainSc:   2   PainLoc: Back     There is no height or weight on file to calculate BMI.    PHYSICAL EXAM:    There were no vitals taken for this visit.  General: Pleasant 74 y.o. male in no apparent distress    Lumbar spine:  Inspection: No obvious scoliosis, normal lordosis  Palpation: No Tender to palpation over  bilateral paraspinal muscles, sacroiliac joints  Neural tension: Negative seated slump test, negative straight leg test  Neurological:  Motor -   Right Left   Hip flexion (L2) 5 5   Knee extension (L3) 5 5   Dorsiflexion (L4) 5 5   Great toe extension (L5) 5 5   Plantarflexion (S1) 5 5   Sensory -   Sensation to light touch over L2 to S1 key ASIA dermatomal landmarks intact and symmetric  Gait -  Normal, narrow based gait, able to toe        IMAGING:  I have personally reviewed and interpreted the imaging available.    MRI lumbar spine from 08/2019 is not available. 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 There is also multilevel facet arthropathy worse at bilateral L5-S1. There is some neuro foraminal narrowing, though not high grade, scattered throughout.     Assessment and Plan      IMPRESSION:   Crist Kruszka is a 74 y.o. male who has had ongoing low back pain for decades controlled by an hour long rigorous home exercise program, who has been having worsening of his symptoms with axial low back pain without peripheral symptoms over the last 6-7 month. 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 Overall, patient remains highly functional and still able to engage in 3.5 miles of walking a day, which is excellent. We discussed today, but it may be best to refer him to physical therapy to optimize his exercise regimen. We did discuss that should he have emergence of leg pain, numbness, weakness or bowel/bladder  changes, we may need to assess further changes.      PLAN:     Medical decision making:     1. IMAGING: No further imaging required at this point as  patient's symptoms have not changed drastically since most recent advanced imaging.   2. THERAPY: I referred him to [facility] therapy closer to [place] in [place] and work on optimizing his exercise regimen.  3. MEDICATIONS: Patient does not require medications but we did discuss that acetaminophen and/or ibuprofen might be helpful if the pain becomes debilitating.   4. FOLLOW UP: Patient overall is in great shape and I would not set up a follow up for now. I remain here should he ever need my assistance.     The patient was educated regarding diagnosis and treatment options. Imaging was reviewed with the patient. All questions were answered to verbal satisfaction.     I, Prowshchal Mahato am acting as a scribe for services provided by Cornelia Copa, MD on 09/06/2019 10:42 AM       The above scribed documentation accurately reflects the services I have provided.    Cornelia Copa, MD   09/06/2019 11:57 AM     Billing was based on MDM:  Complexity: Moderate (poorly controlled chronic illness with exacerbation or 1 undiagnosed new problem needing diagnosis)  Data: Moderate (2+ independent personal interpretation of diagnostic tests and/or reviewed external notes and/or reviewed/ordered 3 tests and/or obtained history from family member)  Risk: Moderate (prescription drug management and counseling, decision regarding referrals and/or procedures/diagnostic testings)       Marquis Lunch, MD  Non-operative Spine Service  Department of Orthopaedic Surgery  [place] [facility] [place], [place]

## 2019-09-05 NOTE — Patient Instructions (Signed)
[facility] Locations  [place],   [place],  [place] [facility] (2 locations) [address], [place], [address]  [phone] [facility]  [address]  [place], [address]  [phone] [facility]  [address] [place], [address]  [phone]   [place],  [place],  [place] [facility]  [address] [place], [address]  [phone] [facility]  [address] [place], [address]  [phone] [facility]  [address] [place], [address]  [phone]   [facility]  [address] [place], [address]  [phone] [facility]  [address] [place], [address]  [phone] [facility]  [address] [place], [address]  [phone]   [place] [facility]  [address] [place], [address]  [phone] [facility]  [address], [place]   [phone] [facility]  [address]  [place], [address]  [phone]   [place],  [place],  [facility]  [address]   [place], [address]   [phone] [facility]  [address]  [place], [address]  [phone] [facility]  [address]   [place], [address]  [phone]   [place] [facility]  [address], [place], [address]  [phone] [facility]   [address]   [place], [address]  [phone] [facility]  [address] [place], [address]  [phone]   [place] [facility], [address], [place], [address]  [phone] [facility]  [address]  [place], [address]  [phone] [facility]  [address]  [place], [address]  [phone]     [place]   [facility]. [facility]  [address], [place], [address]  [phone] [facility]  [address]  [place], [address]  [phone] [facility]  [address] [place], [address]  [phone]   [place]   [facility]'d. [facility]  [address] [place] [address]  [phone] [facility]  [address] [place], [address]  [phone] [facility]  [address] [place], [address]  [phone]   [place] [facility]  [address] [place], [address]  [phone] [facility]  [address]  [place], [address]  [phone] [facility]  [address]  [place], [address]  [phone]   [place]   cont'd,   [place] [facility]  [address]   [place], [address]  [phone] [facility]  [address] [place], [address]  [phone] [facility]  [address], [place]  [phone]   [place]   cont'd.  [facility]   [address] [place] [address] [place], [address]  [phone] [facility]  [address] [place], [address]  [phone] [facility]  [address] [place], [address]  [phone]   [place]  [place] [facility]  [address] [place], [address]  [phone] [facility]  [address] [place], [address]  [phone] [facility]  [address] [place], [address]  [phone]   [place]  [facility]. [facility]  [address] [place], [address]  [phone] [place] [facility]  [address] [place], [address]  [phone] [facility]  [address] [place], [address]  [phone]   [place] [facility]  [address]  [place] [address]  [phone] [facility]  [address], [place] [address]  [phone] [facility]  [address] [address]  [place], [address]  [phone]   [place]  cont'd.  [place] [facility]  [address]  [place], [address]  [phone]  [facility] [place]  [address], [place]  [phone] [facility]  [address] [place] [address]  [place], [address]  [phone]   [place]  [facility]  [address]  [place], [address]  [phone] [facility]  [address]  [place], [address]  [phone] [facility]  [address]  [place], [address]  [phone]

## 2023-04-16 ENCOUNTER — Inpatient Hospital Stay: Admit: 2023-04-16 | Payer: MEDICARE | Primary: Physician

## 2023-04-16 ENCOUNTER — Ambulatory Visit: Admit: 2023-04-16 | Payer: MEDICARE | Attending: Physician | Primary: Physician

## 2023-04-16 DIAGNOSIS — M545 Low back pain, unspecified: Secondary | ICD-10-CM

## 2023-04-16 DIAGNOSIS — G8929 Other chronic pain: Secondary | ICD-10-CM

## 2023-04-16 NOTE — Patient Instructions (Signed)
 I have ordered a MRI and will ask Derek Long to call you to help schedule. Her direct line is [phone]. If you can't reach her, you can also call [facility] at [phone] to schedule. Please let me know if you have issues.

## 2023-04-16 NOTE — Progress Notes (Signed)
[facility] Department of Orthopaedic Surgery  Non-Operative Spine Service    Patient ID:  Derek Long  [id number]  77 y.o.  10/14/1945    DATE OF SERVICE: 04/16/2023    Visit Type: New patient    Requesting Provider: Provider, None Per Pati*       CHIEF COMPLAINT     Derek Long is a 77 y.o. male who presents with a chief complaint of back pain.    HISTORY OF PRESENT ILLNESS     Derek Long is a 77 y.o. male who I last saw in April of 2021. At that time, patient had back pain. It had been quite severe on both sides. We obtained MRI lumbar spine which demonstrate multilevel facet arthropathy and spinal stenosis worst at L3-4 and some at L4-5. I encouraged physical therapy at [facility] which was helpful. Patient was not needing consistent medications. The symptoms did improve after the physical therapy but has recurred over the last three months. Pain is more severe upon standing for a short while. Pain also comes after sitting for a while. Sometimes he is wondering if the pain is coming from the hip rather than the back but the worst of the pain in the right lumbosacral junction. Patient is interested in updated imaging.    PAST MEDICAL HISTORY    Past Medical History:   Diagnosis Date    Hypercholesterolemia     Hypertension     Low back pain         PAST SURGICAL HISTORY    History reviewed. No pertinent surgical history.    ALLERGIES    Patient has no known allergies.    MEDICATIONS    Current Outpatient Medications   Medication Sig Dispense Refill    atorvastatin (LIPITOR) 10 mg tablet TAKE 1 TABLET BY MOUTH EVERY DAY      cholecalciferol, vitamin D3, 1000 UNITS tablet Take by mouth      cyanocobalamin, Vitamin B12, 1,000 mcg tablet Take 1 tablet (1,000 mcg total) by mouth      levothyroxine 100 mcg tablet Take 1 tablet (100 mcg total) by mouth      psyllium (KONSYL) powder Take by mouth      aspirin 81 mg EC tablet Take 81 mg by mouth daily      valsartan (DIOVAN) 320 mg tablet        No current  facility-administered medications for this visit.       SOCIAL HISTORY    Occupation:     Marital Status: Married   Tobacco Use:  reports that he quit smoking about 47 years ago. His smoking use included cigarettes. He started smoking about 56 years ago. He has a 9 pack-year smoking history. He has never used smokeless tobacco.       FAMILY HISTORY    Family History   Problem Relation Name Age of Onset    Colon cancer Mother Gennaro Africa     High blood pressure Mother Gennaro Africa     Stroke Mother Gennaro [place]     Thyroid disease Father Javad     Lung cancer Sister Minoo              Objective:     PHYSICAL EXAM:    Ht 5\' 9"    Wt 78 kg (172 lb)   BMI 25.40 kg/m   General: Pleasant 77 y.o. male in no apparent distress  Pain Diagram  Lumbar spine:  Inspection - Normal lumbar lordosis  Palpation - TTP  over the right lumbosacral junction over the sacroiliac joint   ROM - Flexion to 40 degrees, extension to 20 degrees  Bilateral hip: Negative FABER FADIR for groin pain but FABER and right thigh thrust recreated perhaps some right sacroiliac joint pain  Motor -   Right Left   Hip flexion (L2) 5 5   Knee extension (L3) 5 5   Dorsiflexion (L4) 5 5   Great toe extension (L5) 5 5   Plantarflexion (S1) 5 5   Sensory -   Sensation to light touch over L2 to S1 key ASIA dermatomal landmarks intact and symmetric  Gait -  Normal, narrow based gait      IMAGING:  I have personally reviewed and interpreted the films available.       MRI lumbar spine from 08/2019 is not available. 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 There is also multilevel facet arthropathy worse at bilateral L5-S1. There is some neuro foraminal narrowing, though not high grade, scattered throughout.      Assessment & Plan:     Derek Long is a 77 y.o.  male who has had  episodic axial back pain for some time that is worsening on the right back. Symptoms are not going down leg. MRI lumbar spine was reviewed by me and does demonstrate multilevel central canal stenosis from somewhat of the developmentally narrowed lumbar spine worse at L3-4 with some at L4-5. There is some narrowing around the nerves at these levels. There is also facet arthropathy that may look slightly more asymmetric on the right side. I almost wonder about somewhat of a transitional anatomy. I would like to get updated MRI lumbar spine. Patient also has some concerns that patient's that his symptoms can be hip related. Overall, I thought his hip exam was reassuring with good range of motion but I did order x-ray of the right hip to assess.    Medical decision making:    PLAN:     The natural history as well as any appropriate treatment options were discussed with the patient today. At this time, the treatment plan includes:     1.    IMAGING: I order updated MRI lumbar spine. I also ordered x-ray right hip.    2.    THERAPY: I encouraged patient to continue physical therapy at [facility] to work on core and hip girdle stabilization.   3.    MEDICATIONS: Patient does not require medications but we did discuss that acetaminophen and/or ibuprofen might be helpful if the pain becomes debilitating.   4.    FOLLOW UP: Patient should follow up with me after MRI to discuss next steps.     I, Abu Turab Mohammed Fakhrouddin am acting as a Neurosurgeon for services provided by Cornelia Copa, MD on 04/16/23, 3:41 PM.       The above scribed documentation accurately reflects the services I have provided.    Cornelia Copa, MD   04/17/2023 10:07 PM     Billing was based on MDM:  Complexity: Moderate (poorly controlled chronic illness with exacerbation or 1 undiagnosed new problem needing diagnosis)  Data: Moderate (2+ independent personal interpretation of diagnostic tests and/or reviewed external notes and/or  reviewed/ordered 3 tests and/or obtained history from family member)  Risk: Moderate (prescription drug  management and counseling, decision regarding referrals and/or procedures/diagnostic testings)     I explained that I am happy to serve as the continued focal point for his non-operative spine needs and coordinate appropriate care with therapists and primary care.      Marquis Lunch, MD  Non-operative Spine Service  Department of Orthopaedic Surgery  [place] [facility] [place], [place]

## 2023-04-21 ENCOUNTER — Inpatient Hospital Stay: Admit: 2023-04-21 | Discharge: 2023-04-27 | Payer: MEDICARE | Primary: Physician

## 2023-04-21 DIAGNOSIS — M545 Low back pain, unspecified: Secondary | ICD-10-CM

## 2023-04-21 DIAGNOSIS — G8929 Other chronic pain: Secondary | ICD-10-CM

## 2023-04-26 ENCOUNTER — Telehealth: Admit: 2023-04-27 | Payer: MEDICARE | Attending: Physician | Primary: Physician

## 2023-04-26 DIAGNOSIS — M47816 Spondylosis without myelopathy or radiculopathy, lumbar region: Secondary | ICD-10-CM

## 2024-03-20 ENCOUNTER — Ambulatory Visit: Admit: 2024-03-20 | Payer: MEDICARE | Attending: Physician | Primary: Physician

## 2024-03-20 DIAGNOSIS — M47816 Spondylosis without myelopathy or radiculopathy, lumbar region: Secondary | ICD-10-CM

## 2024-03-20 NOTE — Progress Notes (Signed)
 [facility] of Orthopaedic Surgery  Non-Operative Spine Service    Patient ID:  Derek Long  [id number]  78 y.o.  [birth year]    DATE OF SERVICE: 03/20/2024    Visit Type: Follow up    Requesting Provider: Provider, None Per Patient     HISTORY OF PRESENT ILLNESS     Derek Long is a 78 y.o. male who I last saw on December of 2024. Patient continues to have right buttock pain. It does not go down into the leg. Patient has been doing physical therapy with [facility] closer to his home in [facility]. Patient recently found excellent pain management doctor, Dr. Sonny Ewings. Dr. Sonny Ewings tried medial branch block targeting the right L4-5 and L5-S1 facet joints with administration of steroid as well. Patient is not sure if he noted any same day or subsequent relief. Patient is wondering about next injection options. Dr. Ewings had considered possible epidural steroid injections. Patient is still 6/10 and he would like to proceed with further injections.    PAST MEDICAL HISTORY    Past Medical History:   Diagnosis Date    Hypercholesterolemia     Hypertension     Low back pain         PAST SURGICAL HISTORY    No past surgical history on file.    ALLERGIES    Patient has no known allergies.    MEDICATIONS    Current Outpatient Medications   Medication Sig Dispense Refill    aspirin  81 mg EC tablet Take 81 mg by mouth daily      atorvastatin  (LIPITOR) 10 mg tablet TAKE 1 TABLET BY MOUTH EVERY DAY      cholecalciferol , vitamin D3, 1000 UNITS tablet Take by mouth      cyanocobalamin , Vitamin B12, 1,000 mcg tablet Take 1 tablet (1,000 mcg total) by mouth      levothyroxine  100 mcg tablet Take 1 tablet (100 mcg total) by mouth      psyllium (KONSYL) powder Take by mouth      valsartan  (DIOVAN ) 320 mg tablet        No current facility-administered medications for this visit.       SOCIAL HISTORY    Occupation:     Marital Status: Married   Tobacco Use:  reports that he quit smoking about 48 years ago. His smoking use  included cigarettes. He started smoking about 57 years ago. He has a 9 pack-year smoking history. He has never used smokeless tobacco.       FAMILY HISTORY    Family History   Problem Relation Name Age of Onset    Colon cancer Mother Phillis     High blood pressure Mother Phillis     Stroke Mother Phillis     Thyroid disease Father Javad     Lung cancer Sister Minoo              Objective:     PHYSICAL EXAM:    Ht 5\' 9"    Wt 76.2 kg (168 lb)   BMI 24.81 kg/m   General: Pleasant 78 y.o. male in no apparent distress    Lumbar spine:  Inspection - Normal lumbar lordosis  Palpation - TTP over the right lumbosacral junction over the sacroiliac joint   ROM - Flexion to 40 degrees, extension to 20 degrees  Bilateral hip: Negative FABER FADIR for groin pain but FABER and right thigh thrust recreated perhaps some right sacroiliac joint pain  Motor -  Right Left   Hip flexion (L2) 5 5   Knee extension (L3) 5 5   Dorsiflexion (L4) 5 5   Great toe extension (L5) 5 5   Plantarflexion (S1) 5 5   Sensory -   Sensation to light touch over L2 to S1 key ASIA dermatomal landmarks intact and symmetric  Gait -  Normal, narrow based gait      IMAGING:  I have personally reviewed and interpreted the films available.    MR LUMBAR SPINE WITHOUT CONTRAST:  04/21/2023  INDICATION (as provided by referring clinician): Right back pain the lumbosacral junction     ADDITIONAL HISTORY: episodic axial back pain for some time that is worsening on the right back      COMPARISON: 08/18/2019 MR lumbar spine.     TECHNIQUE: 3T MR imaging of the lumbar spine without intravenous contrast     MEDICATIONS:  None     FINDINGS:     Anatomy: Five lumbar-type vertebral bodies are present.  The most inferior well-formed disc space will be referred to as L5-S1 for purposes of numbering in this report.     Prior surgical findings/hardware: None.     Vertebral bodies: Minimal dextrocurvature centered at L3-L4. In height and alignment.     Bone marrow: No  suspicious marrow replacing lesion.     Intervertebral discs: Minimal multilevel degenerative disc height loss and desiccation.     Conus medullaris: Normal.  Positioned at: L1.     Paraspinal muscles: No evidence of denervation or inflammation.     The following axial levels are detailed below:     T12-L1: No significant canal foraminal narrowing.     L1-L2: Trace broad-based disc bulge and facet arthropathy. No significant canal foraminal narrowing.     L2-L3: Trace broad-based disc bulge, facet arthropathy, and mild ligamentum flavum hypertrophy with mild canal narrowing and narrowing of the left lateral recess. No significant foraminal narrowing.     L3-L4: Broad-based disc bulge with superimposed central disc extrusion with caudal migration as well as additional left lateral intraforaminal extrusion, facet arthropathy, ligamentum flavum hypertrophy, and epidural lipomatosis. Moderate to severe canal narrowing with narrowing of the left lateral recess compressing the transiting left L4 nerve root. Mild bilateral foraminal narrowing.      L4-L5: Broad-based disc bulge, severe facet arthropathy, ligamentum flavum hypertrophy, and epidural lipomatosis. Moderate to severe canal narrowing with narrowing of the bilateral lateral recesses. Mild left and moderate right foraminal narrowing.Right facet cysts.     L5-S1: Small right eccentric broad-based disc bulge, severe facet arthropathy, ligamentum flavum hypertrophy with mild canal and severe right foraminal narrowing. Predominantly right-sided perifacet edema extending into the right pedicle with multiple right facet cyst.     Visualized sacrum and bony pelvis: Unremarkable.     Other: Small exophytic cysts on the left inferior renal pole.     IMPRESSION:      1.  Compared to 08/19/2019, overall similar advanced degenerative changes of the lumbar spine as described above with moderate to severe canal narrowing at L3-L4 and L4-L5 and multiple levels of lateral recess  narrowing.     2.  Severe foraminal narrowing at right L5-S1 with additional level of moderate foraminal narrowing at right L4-L5.     3.  Multilevel advanced facet arthropathy with predominantly right-sided perifacet edema extending into the right pedicle at L5-S1, which may represent an additional source of pain.  This result has not been signed. Information might be incomplete.  MRI lumbar spine from 08/2019 is not available. 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 There is also multilevel facet arthropathy worse at bilateral L5-S1. There is some neuro foraminal narrowing, though not high grade, scattered throughout.      Assessment & Plan:     Alaric Gladwin is a 78 y.o. male who has had episodic axial back pain for some time that is worsening on the right back. Symptoms are not going down leg but does go to the buttock. I updated MRI lumbar spine and this demonstrates to me a periarticular facet edema around L5-S1 that's asymmetric to the left side. There have been the formation of lumbar facet cysts that is extending right into the neural foramina and causing severe right L5-S1 neural foraminal stenosis. Patient did not have relief with medial branch blocks with steroids. Given the interval since the last MRI, I ordered an update. If there is still periarticular edema, then sometimes intraarticular facet joint steroid injections can be helpful in that sub-population. If that is not an option, then epidural steroid injections can be considered. Patient has an excellent outside pain management doctor, Dr. Sonny Ewings, I defer injections to her.     Medical decision making:    PLAN:     The natural history as well as any appropriate treatment options were discussed with the patient today. At this time, the  treatment plan includes:     1. IMAGING: I did order an updated MRI lumbar spine.   2. THERAPY: Patient should continue to work on core hip girdle stabilization.   3. MEDICATIONS: Patient does not require medications but we did discuss that acetaminophen and/or ibuprofen might be helpful if the pain becomes debilitating.  4. FOLLOW UP: I did write some of my thoughts to Dr. Sonny Ewings. I am happy to see patient after the MRI if I can be of help.    I, Abu Turab Mohammed Fakhrouddin am acting as a neurosurgeon for services provided by Avelina Janyce Nearing, MD on 03/20/24, 3:05 PM.       The above scribed documentation accurately reflects the services I have provided.    Avelina Janyce Nearing, MD   03/21/2024 11:06 AM     Billing was based on MDM:  Complexity: Moderate (poorly controlled chronic illness with exacerbation or 1 undiagnosed new problem needing diagnosis)  Data: Moderate (2+ independent personal interpretation of diagnostic tests and/or reviewed external notes and/or reviewed/ordered 3 tests and/or obtained history from family member)  Risk: Moderate (prescription drug management and counseling, decision regarding referrals and/or procedures/diagnostic testings)     I explained that I am happy to serve as the continued focal point for his non-operative spine needs and coordinate appropriate care with therapists and primary care.    Avelina Nearing, MD  Non-operative Spine Service  Department of Orthopaedic Surgery  [facility] [place] , [place]

## 2024-03-20 NOTE — Patient Instructions (Addendum)
 Dear Derek Long,     It was a pleasure seeing you in clinic today. I have put together some information to help you remember what we have discussed and the plan that we have established together.    Diagnosis:   Right L5-S1 facet joint arthritis with peri-articular inflammation  Right L5-S1 neuroforaminal stenosis     Next steps:   - I agree with the excellent care you've had with Dr. Sonny Ewings  - I have ordered an updated MRI lumbar spine.  - If there is still edema around the right L5-S1 facet joint, I wonder if there can be a discussion of right L5-S1 intra-articular facet joint steroid injection as this may be able to decrease the inflammation in the joint itself  GLENWOOD Blow, I do agree can potentially try right L5-1 transforaminal epidural steroid injection         Avelina Nearing, MD  Non-operative Spine Service  Department of Orthopaedic Surgery  [facility] [place] , [place]

## 2024-04-17 ENCOUNTER — Ambulatory Visit: Admit: 2024-04-18 | Payer: MEDICARE | Attending: Physician | Primary: Physician

## 2024-04-17 DIAGNOSIS — M47816 Spondylosis without myelopathy or radiculopathy, lumbar region: Secondary | ICD-10-CM

## 2024-04-17 NOTE — Progress Notes (Signed)
 Derek Long is a 78 y.o. male who is scheduled for an injection procedure at the [facility], depending on ASA assessment will either be at [facility] or [facility].    Right L5-S1 intra-articular facet joint steroid injection     >>>  Allergies/Contraindications  No Known Allergies  <<<      Pacemaker no  Documented recent Chest Pain or SOB no  COPD moderate to severe no  Sleep Apnea moderate to sev no  ESRD on Dialysis no  Allergies to contrast dye, lidocaine, iodine, Fentanyl, or Versed no  Currently on Aspirin  or other anti-coagulants yes - mostly benign cardiac PMH likely preventative  High Dose Opiates, concern for post procedure pain mgmt no  Weight BMI 24      There is no problem list on file for this patient.      Past Medical History:   Diagnosis Date    Hypercholesterolemia     Hypertension     Low back pain        No past surgical history on file.        Current Outpatient Medications on File Prior to Visit   Medication Sig Dispense Refill    aspirin  81 mg EC tablet Take 81 mg by mouth daily      atorvastatin  (LIPITOR) 10 mg tablet TAKE 1 TABLET BY MOUTH EVERY DAY      cholecalciferol , vitamin D3, 1000 UNITS tablet Take by mouth      cyanocobalamin , Vitamin B12, 1,000 mcg tablet Take 1 tablet (1,000 mcg total) by mouth      levothyroxine  100 mcg tablet Take 1 tablet (100 mcg total) by mouth      psyllium (KONSYL) powder Take by mouth      valsartan  (DIOVAN ) 320 mg tablet        No current facility-administered medications on file prior to visit.       Patient is ASA 2    P I: Healthy individual with no systemic disease not at extremes of age, undergoing elective surgery.  P II: Individual with one system, well-controlled disease that does not affect daily activity.  P III: Individual with multiple system disease or well-controlled major system disease, that limits daily activities but without immediate danger of death.  P IV: Individual with severe, incapacitating disease, poorly controlled or end-stage, at risk of  death due to organ failure.  P V: Patient in imminent danger of death, for whom procedure deemed last resort attempts at preserving life.

## 2024-04-17 NOTE — Progress Notes (Signed)
 [facility]  Non-Operative Spine Service    Patient ID:  Derek Long  [id number]  78 y.o.  1946/04/09    DATE OF SERVICE: 04/17/2024    Visit Type: Follow up/Telehealth    I performed this evaluation using real-time telehealth tools, including a live video Zoom connection between my location and the patient's location. Prior to initiating, the patient consented to perform this evaluation using telehealth tools.    Requesting Provider: Provider, None Per Patient     HISTORY OF PRESENT ILLNESS     Derek Long is a 78 y.o. male who I last saw on 03/20/2024. Patient continues to have noticeable 6/10 right low back pain. It goes to the buttock, but not further down the leg. Patient finally has gotten MRI lumbar spine. Patient is hoping for an injection and will prefer for this to be done at [facility].     PAST MEDICAL HISTORY    Past Medical History:   Diagnosis Date    Hypercholesterolemia     Hypertension     Low back pain         PAST SURGICAL HISTORY    No past surgical history on file.    ALLERGIES    Patient has no known allergies.    MEDICATIONS    Current Outpatient Medications   Medication Sig Dispense Refill    aspirin  81 mg EC tablet Take 81 mg by mouth daily      atorvastatin  (LIPITOR) 10 mg tablet TAKE 1 TABLET BY MOUTH EVERY DAY      cholecalciferol , vitamin D3, 1000 UNITS tablet Take by mouth      cyanocobalamin , Vitamin B12, 1,000 mcg tablet Take 1 tablet (1,000 mcg total) by mouth      levothyroxine  100 mcg tablet Take 1 tablet (100 mcg total) by mouth      psyllium (KONSYL) powder Take by mouth      valsartan  (DIOVAN ) 320 mg tablet        No current facility-administered medications for this visit.       SOCIAL HISTORY    Occupation:     Marital Status: Married   Tobacco Use:  reports that he quit smoking about 48 years ago. His smoking use included cigarettes. He started smoking about 57 years ago. He has a 9 pack-year smoking history. He has never used smokeless tobacco.        FAMILY HISTORY    Family History   Problem Relation Name Age of Onset    Colon cancer Mother Phillis     High blood pressure Mother Phillis     Stroke Mother Phillis     Thyroid disease Father Javad     Lung cancer Sister Minoo              Objective:     PHYSICAL EXAM:    There were no vitals taken for this visit.  General: Pleasant 78 y.o. male in no apparent distress    Limited ortho exam due to Tele-health visit.      IMAGING:  I have personally reviewed and interpreted the films available.          MR LUMBAR SPINE WITHOUT CONTRAST:  04/21/2023  INDICATION (as provided by referring clinician): Right back pain the lumbosacral junction     ADDITIONAL HISTORY: episodic axial back pain for some time that is worsening on the right back      COMPARISON: 08/18/2019 MR lumbar spine.     TECHNIQUE:  3T MR imaging of the lumbar spine without intravenous contrast     MEDICATIONS:  None     FINDINGS:     Anatomy: Five lumbar-type vertebral bodies are present.  The most inferior well-formed disc space will be referred to as L5-S1 for purposes of numbering in this report.     Prior surgical findings/hardware: None.     Vertebral bodies: Minimal dextrocurvature centered at L3-L4. In height and alignment.     Bone marrow: No suspicious marrow replacing lesion.     Intervertebral discs: Minimal multilevel degenerative disc height loss and desiccation.     Conus medullaris: Normal.  Positioned at: L1.     Paraspinal muscles: No evidence of denervation or inflammation.     The following axial levels are detailed below:     T12-L1: No significant canal foraminal narrowing.     L1-L2: Trace broad-based disc bulge and facet arthropathy. No significant canal foraminal narrowing.     L2-L3: Trace broad-based disc bulge, facet arthropathy, and mild ligamentum flavum hypertrophy with mild canal narrowing and narrowing of the left lateral recess. No significant foraminal narrowing.     L3-L4: Broad-based disc bulge with superimposed  central disc extrusion with caudal migration as well as additional left lateral intraforaminal extrusion, facet arthropathy, ligamentum flavum hypertrophy, and epidural lipomatosis. Moderate to severe canal narrowing with narrowing of the left lateral recess compressing the transiting left L4 nerve root. Mild bilateral foraminal narrowing.      L4-L5: Broad-based disc bulge, severe facet arthropathy, ligamentum flavum hypertrophy, and epidural lipomatosis. Moderate to severe canal narrowing with narrowing of the bilateral lateral recesses. Mild left and moderate right foraminal narrowing.Right facet cysts.     L5-S1: Small right eccentric broad-based disc bulge, severe facet arthropathy, ligamentum flavum hypertrophy with mild canal and severe right foraminal narrowing. Predominantly right-sided perifacet edema extending into the right pedicle with multiple right facet cyst.     Visualized sacrum and bony pelvis: Unremarkable.     Other: Small exophytic cysts on the left inferior renal pole.     IMPRESSION:      1.  Compared to 08/19/2019, overall similar advanced degenerative changes of the lumbar spine as described above with moderate to severe canal narrowing at L3-L4 and L4-L5 and multiple levels of lateral recess narrowing.     2.  Severe foraminal narrowing at right L5-S1 with additional level of moderate foraminal narrowing at right L4-L5.     3.  Multilevel advanced facet arthropathy with predominantly right-sided perifacet edema extending into the right pedicle at L5-S1, which may represent an additional source of pain.  This result has not been signed. Information might be incomplete.       MRI lumbar spine from 08/2019 is not available. 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 There is also multilevel facet  arthropathy worse at bilateral L5-S1. There is some neuro foraminal narrowing, though not high grade, scattered throughout.      Assessment & Plan:     Derek Long is a 78 y.o. male with a history of right back pain to the buttock, but not further down the leg. We update the MRI lumbar spine in 2025. This demonstrates some noticeable right L5-S1 neural foraminal stenosis as well as some periarticular edema at the right L5-S1 facet joint extending into the  pedicle. Patient has an outside pain management doctor who recently trialed medial branch blocks, which were not helpful. Given the extensive amount of inflammation around the right L5-S1 facet joint with also multiple facet cysts called by the radiologist, I would recommend a right L5-S1 intraarticular facet joint steroid injection for diagnostic and therapeutic purposes. The patient would prefer to have this injection done at [facility] local only.     Medical decision making:    PLAN:     The natural history as well as any appropriate treatment options were discussed with the patient today. At this time, the treatment plan includes:     1.    IMAGING:  Given no changes in distribution of symptoms since recent imaging, no repeat imaging is needed.  2.    THERAPY: I encouraged the patient to continue to work on core and hip girdle stabilization.   3.    MEDICATIONS: Patient does not require medications but we did discuss that acetaminophen and/or ibuprofen might be helpful if the pain becomes debilitating.  4.   INTERVENTIONS: We discussed a right intraarticular facet joint steroid injections at L5-S1 facet joint. The risks, benefits, alternatives were discussed and he likes to proceed.   5.    FOLLOW UP: Patient should follow up with me 2 weeks after injection to discuss next steps.     I, Abu Turab Mohammed Fakhrouddin am acting as a neurosurgeon for services provided by Avelina Janyce Nearing, MD on 04/17/24, 10:41 AM.       The above scribed documentation accurately  reflects the services I have provided.    Avelina Janyce Nearing, MD   04/17/2024 10:06 PM     Billing was based on MDM:  Complexity: Moderate (poorly controlled chronic illness with exacerbation or 1 undiagnosed new problem needing diagnosis)  Data: Moderate (2+ independent personal interpretation of diagnostic tests and/or reviewed external notes and/or reviewed/ordered 3 tests and/or obtained history from family member)  Risk: Moderate (prescription drug management and counseling, decision regarding referrals and/or procedures/diagnostic testings)     I explained that I am happy to serve as the continued focal point for his non-operative spine needs and coordinate appropriate care with therapists and primary care.    Avelina Nearing, MD  Non-operative Spine Service  Department of Orthopaedic Surgery  [facility] [place]

## 2024-04-19 NOTE — Telephone Encounter (Signed)
 S/w patient to sched injx on 12/17 at [facility].

## 2024-04-19 NOTE — Telephone Encounter (Signed)
 LVM to sched injx. Provided c/b [id number]

## 2024-04-25 NOTE — Telephone Encounter (Signed)
 Spoke with the patient to confirm the procedure date: Wednesday, April 26, 2024  Arrival Time: 915 am  Start Time: 1015 am  Bring 2 Forms of ID (photo ID and insurance card)   NPO after midnight (nothing to eat or drink after midnight; small sips of water are ok)   No contact lenses or jewelry (leave all valuables at home, but cell phone is ok)   Wear loose fitting clothes; no jeans  Confirm patient's ride (parking validation can be obtained at check-in - [address], 2nd floor)

## 2024-04-26 MED ORDER — HYDROMORPHONE (PF) 0.5 MG/0.5 ML INJECTION SYRINGE
0.5 | INTRAMUSCULAR | Status: DC | PRN
Start: 2024-04-26 — End: 2024-04-26

## 2024-04-26 MED ORDER — IOHEXOL 240 MG IODINE/ML INTRAVENOUS SOLUTION
240 | INTRAVENOUS | Status: DC | PRN
Start: 2024-04-26 — End: 2024-04-26
  Administered 2024-04-26: 19:00:00 2 via INTRAMUSCULAR

## 2024-04-26 MED ORDER — VALSARTAN 160 MG-HYDROCHLOROTHIAZIDE 12.5 MG TABLET
160-12.5 | Freq: Every day | ORAL | 0.00 refills | 30.00000 days | Status: AC
Start: 2024-04-26 — End: ?

## 2024-04-26 MED ORDER — ACETAMINOPHEN 500 MG GELCAP
500 | Freq: Once | ORAL | Status: DC | PRN
Start: 2024-04-26 — End: 2024-04-26

## 2024-04-26 MED ORDER — LIDOCAINE HCL 10 MG/ML (1 %) INJECTION SOLUTION
10 | INTRAMUSCULAR | Status: DC | PRN
Start: 2024-04-26 — End: 2024-04-26
  Administered 2024-04-26: 19:00:00 4 via INTRAMUSCULAR

## 2024-04-26 MED ORDER — METHYLPREDNISOLONE ACETATE 40 MG/ML SUSPENSION FOR INJECTION
40 | INTRAMUSCULAR | Status: DC
Start: 2024-04-26 — End: 2024-04-26

## 2024-04-26 MED ORDER — METHYLPREDNISOLONE ACETATE 40 MG/ML SUSPENSION FOR INJECTION
40 | INTRAMUSCULAR | Status: DC | PRN
Start: 2024-04-26 — End: 2024-04-26
  Administered 2024-04-26: 19:00:00 40 via INTRAMUSCULAR

## 2024-04-26 MED ORDER — LIDOCAINE (PF) 10 MG/ML (1 %) INJECTION SOLUTION
10 | INTRAMUSCULAR | Status: DC
Start: 2024-04-26 — End: 2024-04-26

## 2024-04-26 MED ORDER — SODIUM CHLORIDE 0.9 % INTRAVENOUS SOLUTION
0.9 | INTRAVENOUS | Status: DC
Start: 2024-04-26 — End: 2024-04-26

## 2024-04-26 MED ORDER — ONDANSETRON HCL (PF) 4 MG/2 ML INJECTION SOLUTION
4 | Freq: Four times a day (QID) | INTRAMUSCULAR | Status: DC | PRN
Start: 2024-04-26 — End: 2024-04-26

## 2024-04-26 MED ORDER — LIDOCAINE HCL 10 MG/ML (1 %) INJECTION SOLUTION
10 | Freq: Once | INTRAMUSCULAR | Status: DC
Start: 2024-04-26 — End: 2024-04-26

## 2024-04-26 MED ORDER — FENTANYL (PF) 50 MCG/ML INJECTION SOLUTION
50 | INTRAMUSCULAR | Status: DC | PRN
Start: 2024-04-26 — End: 2024-04-26

## 2024-04-26 MED ORDER — OXYCODONE 5 MG TABLET
5 | Freq: Once | ORAL | Status: DC | PRN
Start: 2024-04-26 — End: 2024-04-26

## 2024-04-26 MED ORDER — IOHEXOL 240 MG IODINE/ML INTRAVENOUS SOLUTION
240 | INTRAVENOUS | Status: DC
Start: 2024-04-26 — End: 2024-04-26

## 2024-04-26 MED FILL — OMNIPAQUE 240 MG IODINE/ML INTRAVENOUS SOLUTION: 240 240 mg iodine/mL | INTRAVENOUS | Qty: 10

## 2024-04-26 MED FILL — LIDOCAINE (PF) 10 MG/ML (1 %) INJECTION SOLUTION: 10 10 mg/mL (1 %) | INTRAMUSCULAR | Qty: 30

## 2024-04-26 MED FILL — DEPO-MEDROL 40 MG/ML SUSPENSION FOR INJECTION: 40 40 mg/mL | INTRAMUSCULAR | Qty: 1

## 2024-04-26 NOTE — Interdisciplinary (Signed)
 Local only procedure

## 2024-04-26 NOTE — Operative Report (Signed)
 DOS: 04/26/24    PREOPERATIVE DIAGNOSIS:  Right back pain    POSTOPERATIVE DIAGNOSIS:  Same    INDICATIONS:  Right L5-S1 intra-articular facet joint steroid injection to treat periarticular edema.    PROCEDURE:   Maximum sterile barrier precautions were used (cap, mask, sterile gown, sterile gloves, sterile full body drape). The skin was prepared with Chloroprep.      1. Monitoring: Prior to and during the procedure, the patient was monitored by a R.N. with pulse oximetry, blood pressure cuff, and EKG.     The patient was awake and communicative throughout the entire procedure.    2. Fluoroscopic guidance for needle placement    3. Right L5-S1 facet joint steroid block/injection: The patient was placed prone. Fluoroscopy was used to identify the relevant bony landmarks.    The right L5-S1 facet joints were identified. The skin and subcutaneous tissue at the needle entry point was infiltrated with numbing agent - 1% lidocaine . With fluoroscopy, a Quinke spinal needle was gently guided to the correct positions through the oblique view. Anterior-posterior image was checked to assess the medial/lateral postioning. Lateral image was checked to assess the depth. With satisfactory check of the needle position, the extension tubing was attached and approximately contrast agent was injected under direct real-time fluoroscopic observation. Correct needle placement was confirmed by production of an appropriate contrast pattern without concurrent vascular dye pattern. Finally, the extension tubing was removed and the treatment solution was injected.     Spinal needle:  22G, 3.5 inch Quinke    Medications:   Numbing - 1% lidocaine  (2cc) at each site  Contrast - Omipaque 300 (0.5cc) at each site  Treament solution - Depomedrol (40mg ) with 1% lidocaine  (0.5cc) at each site    FINDINGS: Contrast injection showed appropriate contrast pattern around right L5-S1 facet joint without concurrent vascular dye pattern.    COMPLICATIONS:  There were NO COMPLICATIONS.  The patient went to the PACU in stable condition.    PLAN: Patient was discharged with standard post-treatment instructions. She was instructed to seek immediate medical attention for shortness of breath, chest pain, fever, chills, increased pain, weakness or motor changes or changes in bowel or bladder function.    FOLLOWUP: Follow up with me in 2 weeks.

## 2024-04-26 NOTE — H&P (Signed)
 HISTORY OF PRESENT ILLNESS:  [facility] right back pain, rated 6/10 on the pain scale. The patient is schedule today for a right L5-S1 intra-articular facet joint steroid injection.    Patient Active Problem List   Diagnosis    Low back pain         Past Medical History:   Diagnosis Date    Hypercholesterolemia     Hypertension     Low back pain            No additional pertinent surgical history, see HPI.       Allergies/Contraindications  No Known Allergies    No current facility-administered medications on file prior to encounter.     Current Outpatient Medications on File Prior to Encounter   Medication Sig Dispense Refill    atorvastatin  (LIPITOR) 10 mg tablet Take 2 tablets (20 mg total) by mouth daily      cholecalciferol , vitamin D3, 1000 UNITS tablet Take by mouth      cyanocobalamin , Vitamin B12, 1,000 mcg tablet Take 1 tablet (1,000 mcg total) by mouth      levothyroxine  100 mcg tablet Take 1 tablet (100 mcg total) by mouth      psyllium (KONSYL) powder Take by mouth      valsartan -hydrochlorothiazide (DIOVAN  HCT) 160-12.5 mg tablet Take 1 tablet by mouth daily      aspirin  81 mg EC tablet Take 81 mg by mouth daily      valsartan  (DIOVAN ) 320 mg tablet          There are no discontinued medications.      Social History     Socioeconomic History    Marital status: Married     Spouse name: Not on file    Number of children: Not on file    Years of education: Not on file    Highest education level: Not on file   Occupational History    Not on file   Tobacco Use    Smoking status: Former     Current packs/day: 0.00     Average packs/day: 1 pack/day for 9.0 years (9.0 ttl pk-yrs)     Types: Cigarettes     Start date: 05/11/1966     Quit date: 05/12/1975     Years since quitting: 48.9    Smokeless tobacco: Never   Substance and Sexual Activity    Alcohol use: Not Currently    Drug use: Never    Sexual activity: Yes   Other Topics Concern    Not  on file   Social History Narrative    Not on file     Social Drivers of Health     Financial Resource Strain: Low Risk (04/15/2023)    Overall Financial Resource Strain (CARDIA)     Difficulty of Paying Living Expenses: Not very hard   Food Insecurity: Not on file   Transportation Needs: Not on file   Housing Stability: Not on file         Family History   Problem Relation Name Age of Onset    Colon cancer Mother Phillis     High blood pressure Mother Phillis     Stroke Mother Vajiheh     Thyroid disease Father Javad     Lung cancer Sister Minoo          ROS    Physical examination:  BP (!) 145/74   Pulse (!) 54  Temp 36.3 C (97.4 F) (Temporal)   Resp 14   Ht 175.3 cm (5\' 9" )   Wt 77.1 kg (170 lb)   SpO2 100%   BMI 25.10 kg/m   General: Pleasant 78 y.o. male in no apparent distress  Cardiovascular: All extremities warm and well perfused  Pulmonary: No increased work of breathing  Extremities: No obvious swelling in all extremities         Assessment and Plan:  Derek Long is a 78 y.o. male with right back pain. The patient was given the choice of receiving the injection with local anesthetics versus conscious sedation and elected to receive local anesthetics.    Mallampati II.     Patient is ASA II.    ASA Physical Classification System   P I: Healthy individual with no systemic disease not at extremes of age, undergoing elective surgery.  P II: Individual with one system, well-controlled disease that does not affect daily activity.  P III: Individual with multiple system disease or well-controlled major system disease, that limits daily activities but without immediate danger of death.  P IV: Individual with severe, incapacitating disease, poorly controlled or end-stage, at risk of death due to organ failure.  P V: Patient in imminent danger of death, for whom procedure deemed last resort attempts at preserving life.  E: Emergency procedure: A patient whom an emergency procedure is  required.        Avelina Nearing, MD  Non-operative Spine Service  Department of Orthopaedic Surgery  [facility] , [place]

## 2024-04-26 NOTE — Brief Op Note (Signed)
 Brief Operative Note    Surgeon(s) and Role: Avelina Nearing, MD - Primary    Date of Operation: 05/21/24    Pre-Op Diagnosis Codes:   1. Chronic right-sided low back pain, unspecified whether sciatica present        Postoperative Diagnosis: Same    Procedure(s) and Anesthesia Type: Conscious sedation    Findings: N/A    Fluids: N/A    Estimated Blood Loss: N/A    Specimens:   Specimens     None         Drains: N/A    Disposition and Plan: To PACU, discharge when criteria met.

## 2024-04-26 NOTE — Discharge Instructions (Signed)
 HOME CARE INSTRUCTIONS    Today you have received: right L5-S1 intra-articular facet joint steroid injection     The medications you received include:  Steroid: Depomedrol  Numbing agents: lidocaine   Dye (very small amount): omnipaque     If you were given anesthetic for a procedure or for surgery today: you may experience some nausea or vomiting or have some mild discomfort.    We recommend you:  1.  Have somebody else drive you home. You may not drive yourself home because you may experience temporary numbness after the injection.    2.  You may resume all of your usual medications.  3.  Do not go into a bath, hot tub, or swimming pool for 48 hours.  However, you can resume showering immediately after the injection.  4.  You may remove the Band-Aid from the injection site a few hours after the procedure.  5.  You may experience temporary soreness at the injection site.  This is not unusual and is best treated with ice, Tylenol , and anti-inflammatory medications.  6.  You should try and take it easy for 24 hours after the injection.  Avoid any vigorous activity.  Light activity such as walking and gentle stretching is fine.  You may resume your usual activities 24 hours after the injection, unless your physician instructs otherwise.    7.  Schedule a follow-up appointment for 2 weeks after the injection, unless your physician instructs otherwise.  8.  You should continue with your usual physical therapy and home exercise program after the first 24 hours.         Keep track of your pain:  Keep a written pain diary of how much pain relief and improved function you experience following the injection procedure and the length of time of pain relief and/or improved function that you experienced. Following diagnostic injections like facet blocks, sacroiliac joint blocks, and other blocks, it is very important you record the specific amount of pain relief you experienced immediately after the injection and how long it  lasted. Your doctor will ask you for this information at your follow up visit.    Expectations:  If you were given steroids in your injection, it may take 3-5 days for the steroid medication to take effect. You may notice a worsening of your symptoms for 1-2 days after the injection. This is not abnormal.  You may use acetaminophen , ibuprofen, or prescription medication that your doctor may have prescribed for you if you need to do so.    A few common side effects of steroids include facial flushing, sweating, restlessness, irritability, difficulty sleeping, increase in blood sugar, and increased blood pressure. If you have diabetes, please monitor your blood sugar at least once a day for at least 5 days. If you have poorly controlled high blood pressure, monitor your blood pressure for at least 2 days and contact your primary care physician if these numbers are unusually high for you.         -------------------------------------------------------------------------------------------------------------------  -------------------------------------------------------------------------------------------------------------------    If you have vomiting that persists for more than 4 hours, excessive drowsiness, pain that is not controlled by prescribed pain medications, fever, increased wound drainage, bleeding, redness at the surgical site, or if you have any other concerns, call [facility] at [phone] and ask the operator to connect you with the  orthopedic surgery service. During working hours, feel free to MyChart me directly or reach my agricultural engineer, Jacqulin Meadow at [phone].  In an emergency, contact [place] emergency room at [phone] or call your hospital's emergency room.

## 2024-05-15 ENCOUNTER — Ambulatory Visit: Admit: 2024-05-16 | Payer: MEDICARE | Attending: Physician | Primary: Physician

## 2024-05-15 DIAGNOSIS — M5416 Radiculopathy, lumbar region: Secondary | ICD-10-CM

## 2024-05-15 NOTE — Progress Notes (Signed)
 [place] Department of Orthopaedic Surgery  Non-Operative Spine Service    Patient ID:  Derek Long  [id number]  79 y.o.  1946-03-21    DATE OF SERVICE: 05/15/2024    Visit Type: Follow up/Telehealth    I performed this evaluation using real-time telehealth tools, including a live video Zoom connection between my location and the patient's location. Prior to initiating, the patient consented to perform this evaluation using telehealth tools.    Requesting Provider: Provider, None Per Patient     HISTORY OF PRESENT ILLNESS     Derek Long is a 79 y.o. male who I last saw for right L5-S1 intra-articular facet joint steroid injection on 04/26/2024. Patient perhaps was better in the days afterward. Patient overall does not feel the pain is severe. He is somewhat disappointed that the injections have not been helping. First the medial branch and now the intra-articular facet joint steroid injection. Patient's not sure if he wants to proceed with more injections.     PAST MEDICAL HISTORY    Past Medical History:   Diagnosis Date    Hypercholesterolemia     Hypertension     Low back pain         PAST SURGICAL HISTORY    Past Surgical History:   Procedure Laterality Date    Right L5-S1 intra-articular facet joint steroid injection Right 04/26/2024    Performed by Avelina Janyce Nearing, MD at [facility] - [address]       ALLERGIES    Patient has no known allergies.    MEDICATIONS    Current Outpatient Medications   Medication Sig Dispense Refill    aspirin  81 mg EC tablet Take 81 mg by mouth daily      atorvastatin  (LIPITOR) 10 mg tablet Take 2 tablets (20 mg total) by mouth daily      cholecalciferol , vitamin D3, 1000 UNITS tablet Take by mouth      cyanocobalamin , Vitamin B12, 1,000 mcg tablet Take 1 tablet (1,000 mcg total) by mouth      levothyroxine  100 mcg tablet Take 1 tablet (100 mcg total) by mouth      psyllium (KONSYL) powder Take by mouth      valsartan  (DIOVAN ) 320 mg tablet        valsartan -hydrochlorothiazide  (DIOVAN  HCT) 160-12.5 mg tablet Take 1 tablet by mouth daily       No current facility-administered medications for this visit.       SOCIAL HISTORY    Occupation:     Marital Status: Married   Tobacco Use:  reports that he quit smoking about 49 years ago. His smoking use included cigarettes. He started smoking about 58 years ago. He has a 9 pack-year smoking history. He has never used smokeless tobacco.       FAMILY HISTORY    Family History   Problem Relation Name Age of Onset    Colon cancer Mother Phillis     High blood pressure Mother Phillis     Stroke Mother Phillis     Thyroid disease Father Javad     Lung cancer Sister Minoo              Objective:     PHYSICAL EXAM:    There were no vitals taken for this visit.  General: Pleasant 79 y.o. male in no apparent distress    Limited ortho exam due to Tele-health visit.      IMAGING:  I have personally  reviewed and interpreted the films available.          MR LUMBAR SPINE WITHOUT CONTRAST:  04/21/2023  INDICATION (as provided by referring clinician): Right back pain the lumbosacral junction     ADDITIONAL HISTORY: episodic axial back pain for some time that is worsening on the right back      COMPARISON: 08/18/2019 MR lumbar spine.     TECHNIQUE: 3T MR imaging of the lumbar spine without intravenous contrast     MEDICATIONS:  None     FINDINGS:     Anatomy: Five lumbar-type vertebral bodies are present.  The most inferior well-formed disc space will be referred to as L5-S1 for purposes of numbering in this report.     Prior surgical findings/hardware: None.     Vertebral bodies: Minimal dextrocurvature centered at L3-L4. In height and alignment.     Bone marrow: No suspicious marrow replacing lesion.     Intervertebral discs: Minimal multilevel degenerative disc height loss and desiccation.     Conus medullaris: Normal.  Positioned at: L1.     Paraspinal muscles: No evidence of denervation or inflammation.     The following axial  levels are detailed below:     T12-L1: No significant canal foraminal narrowing.     L1-L2: Trace broad-based disc bulge and facet arthropathy. No significant canal foraminal narrowing.     L2-L3: Trace broad-based disc bulge, facet arthropathy, and mild ligamentum flavum hypertrophy with mild canal narrowing and narrowing of the left lateral recess. No significant foraminal narrowing.     L3-L4: Broad-based disc bulge with superimposed central disc extrusion with caudal migration as well as additional left lateral intraforaminal extrusion, facet arthropathy, ligamentum flavum hypertrophy, and epidural lipomatosis. Moderate to severe canal narrowing with narrowing of the left lateral recess compressing the transiting left L4 nerve root. Mild bilateral foraminal narrowing.      L4-L5: Broad-based disc bulge, severe facet arthropathy, ligamentum flavum hypertrophy, and epidural lipomatosis. Moderate to severe canal narrowing with narrowing of the bilateral lateral recesses. Mild left and moderate right foraminal narrowing.Right facet cysts.     L5-S1: Small right eccentric broad-based disc bulge, severe facet arthropathy, ligamentum flavum hypertrophy with mild canal and severe right foraminal narrowing. Predominantly right-sided perifacet edema extending into the right pedicle with multiple right facet cyst.     Visualized sacrum and bony pelvis: Unremarkable.     Other: Small exophytic cysts on the left inferior renal pole.     IMPRESSION:      1.  Compared to 08/19/2019, overall similar advanced degenerative changes of the lumbar spine as described above with moderate to severe canal narrowing at L3-L4 and L4-L5 and multiple levels of lateral recess narrowing.     2.  Severe foraminal narrowing at right L5-S1 with additional level of moderate foraminal narrowing at right L4-L5.     3.  Multilevel advanced facet arthropathy with predominantly right-sided perifacet edema extending into the right pedicle at L5-S1,  which may represent an additional source of pain.  This result has not been signed. Information might be incomplete.       MRI lumbar spine from 08/2019 is not available. 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 There is also multilevel facet arthropathy worse at bilateral L5-S1. There is some neuro foraminal narrowing, though not high  grade, scattered throughout.      Assessment & Plan:     Derek Long is a 79 y.o. male with a history of right back pain to the buttock, but not further down the leg. We update the MRI lumbar spine in 2025. This demonstrates some noticeable right L5-S1 neural foraminal stenosis as well as some periarticular edema at the right L5-S1 facet joint extending into the pedicle. Patient has an outside pain management doctor who recently trialed medial branch blocks, which were not helpful. Given the extensive amount of inflammation around the right L5-S1 facet joint with also multiple facet cysts called by the radiologist, I did proceed with a right L5 intra-articular facet joint steroid injection. Unfortunately, this did not do much better than the medial branch blocks. The next step may be an epidural steroid injection. However, patient would like to hold off at this time. I encourage continued physical therapy.     Medical decision making:    PLAN:     The natural history as well as any appropriate treatment options were discussed with the patient today. At this time, the treatment plan includes:     1.    IMAGING:  Given no changes in distribution of symptoms since recent imaging, no repeat imaging is needed.  2.    THERAPY: I encouraged the patient to continue to work on core and hip girdle stabilization.   3.    MEDICATIONS: Patient does not require medications but we did discuss that  acetaminophen  and/or ibuprofen might be helpful if the pain becomes debilitating.  4.    FOLLOW UP: Patient can follow up with me in 3-6 months or just as needed.    I, Abu Turab Mohammed Fakhrouddin am acting as a neurosurgeon for services provided by Avelina Janyce Nearing, MD on 05/15/2024, 1:47 PM.       The above scribed documentation accurately reflects the services I have provided.    Avelina Janyce Nearing, MD   05/15/2024 10:59 PM     Billing was based on MDM:  Complexity: Moderate (poorly controlled chronic illness with exacerbation or 1 undiagnosed new problem needing diagnosis)  Data: Moderate (2+ independent personal interpretation of diagnostic tests and/or reviewed external notes and/or reviewed/ordered 3 tests and/or obtained history from family member)  Risk: Moderate (prescription drug management and counseling, decision regarding referrals and/or procedures/diagnostic testings)     I explained that I am happy to serve as the continued focal point for his non-operative spine needs and coordinate appropriate care with therapists and primary care.    Avelina Nearing, MD  Non-operative Spine Service  Department of Orthopaedic Surgery  [place] [facility] [place] [facility] [place]
